# Patient Record
Sex: Female | Born: 1955 | Marital: Married | Smoking: Never smoker
Health system: Southern US, Community
[De-identification: ages and names within clinical notes are randomized; demographics above are authoritative.]

## PROBLEM LIST (undated history)

## (undated) HISTORY — PX: SYNOVIAL CYST EXCISION: SUR507

## (undated) HISTORY — PX: TONSILLECTOMY: SUR1361

## (undated) HISTORY — PX: KNEE SURGERY: SHX244

---

## 1983-06-02 HISTORY — PX: BREAST ENHANCEMENT SURGERY: SHX7

## 2008-10-02 DIAGNOSIS — C439 Malignant melanoma of skin, unspecified: Secondary | ICD-10-CM

## 2008-10-02 HISTORY — DX: Malignant melanoma of skin, unspecified: C43.9

## 2011-01-26 ENCOUNTER — Encounter: Payer: Self-pay | Admitting: Neurology

## 2011-01-29 ENCOUNTER — Ambulatory Visit: Payer: Self-pay | Admitting: Neurology

## 2011-02-05 ENCOUNTER — Ambulatory Visit (INDEPENDENT_AMBULATORY_CARE_PROVIDER_SITE_OTHER): Payer: 59 | Admitting: Psychology

## 2011-02-05 DIAGNOSIS — F09 Unspecified mental disorder due to known physiological condition: Secondary | ICD-10-CM

## 2011-03-03 ENCOUNTER — Encounter (INDEPENDENT_AMBULATORY_CARE_PROVIDER_SITE_OTHER): Payer: 59 | Admitting: Psychology

## 2011-03-03 DIAGNOSIS — F0280 Dementia in other diseases classified elsewhere without behavioral disturbance: Secondary | ICD-10-CM

## 2011-03-16 ENCOUNTER — Encounter (INDEPENDENT_AMBULATORY_CARE_PROVIDER_SITE_OTHER): Payer: 59 | Admitting: Psychology

## 2011-03-16 DIAGNOSIS — F0280 Dementia in other diseases classified elsewhere without behavioral disturbance: Secondary | ICD-10-CM

## 2011-10-05 ENCOUNTER — Ambulatory Visit (HOSPITAL_COMMUNITY): Payer: 59 | Admitting: Psychology

## 2011-10-05 ENCOUNTER — Encounter (HOSPITAL_COMMUNITY): Payer: 59 | Admitting: Psychology

## 2011-10-06 ENCOUNTER — Ambulatory Visit (INDEPENDENT_AMBULATORY_CARE_PROVIDER_SITE_OTHER): Payer: 59 | Admitting: Psychology

## 2011-10-06 DIAGNOSIS — G259 Extrapyramidal and movement disorder, unspecified: Secondary | ICD-10-CM

## 2011-10-06 DIAGNOSIS — F09 Unspecified mental disorder due to known physiological condition: Secondary | ICD-10-CM

## 2011-10-06 DIAGNOSIS — G2570 Drug induced movement disorder, unspecified: Secondary | ICD-10-CM

## 2011-11-02 ENCOUNTER — Ambulatory Visit (HOSPITAL_COMMUNITY): Payer: Self-pay | Admitting: Psychology

## 2011-11-03 ENCOUNTER — Ambulatory Visit (INDEPENDENT_AMBULATORY_CARE_PROVIDER_SITE_OTHER): Payer: 59 | Admitting: Psychology

## 2011-11-17 ENCOUNTER — Ambulatory Visit (INDEPENDENT_AMBULATORY_CARE_PROVIDER_SITE_OTHER): Payer: 59 | Admitting: Psychology

## 2012-01-12 ENCOUNTER — Encounter (HOSPITAL_COMMUNITY): Payer: Self-pay | Admitting: Psychology

## 2012-01-12 NOTE — Progress Notes (Signed)
Patient:   Robin Armstrong   DOB:   1956-02-11  MR Number:  [id number]  Location:  [facility] [address] [place] [address] Dept: [phone]           Date of Service:   10/06/2011  Start Time:   10 a.m. End Time:   11 AM  Provider/Observer:  Hershal Coria PSYD       Billing Code/Service: [phone]  Chief Complaint:     Chief Complaint  Patient presents with  . Memory Loss  . Other    Expressive language difficulties, tremors, and other cognitive changes    Reason for Service:  The patient is back for followup neuropsychological testing as followup from her evaluation done in October of 2012. At that time, the patient displayed some significant neuropsychological deficits and fell almost exclusively with regard to memory deficits and particularly in the auditory/verbal areas. Gen. IQ and cognitive abilities were all in the average to high average range with no deficits with regard to any subtests on the formal IQ measures. She displayed average to high average performance in every measure assessed with some mild weakness 4 information processing speed and mathematical abilities. Comparing these to objective assessment of learning and memory showed clear deficits. Significant deficits on measures of auditory learning there were almost exclusively in the areas of storage of new information. While the evaluation did consider the possibility of cortically mediated progressive dementia is that were not consistent with Parkinson's or patterns consistent with Alzheimer's new information after that evaluation was conducted suggested that she had what appeared to be a mild head injury 17 months earlier from that evaluation. These focal memory deficits could very well be related to residual effects of a mild head injury. However, repeat neuropsychological testing would need to be done to rule out rule in  progressive nature to this condition. If this pattern of neuropsychological deficits remain stable will very likely be more indicative of residual effects of a mild head injury and not those of a progressive neuropsychological condition such as Parkinson's/Alzheimer's/other cortically mediated progressive dementias.  Current Status:  The patient reports that changes since her last visit included increasing fine motor difficulties and dropping things. She reports that her right eye is "heavy" when compared to her left thigh but that she is not feeling sleepy. The patient's Trileptal was decreased in response to this. The patient does report that she is continuing to have some memory problems. She reports that when she is driving but she is at times where she will go past where she is going before realizing this reports that this is very much the same as before. Patient reports that she feels like she is "zoning out" more than before. She reports that she feels weaker but does not feel particularly fatigued. She just feels like there is a decrease in strength. The patient reports that she has not been feeling particularly depressed but is isolating more from others do to forgetting names and concern about memory difficulties. She reports that she continues to forget where she put stuff. She reports that she is still having days where she is shaking in her hands and that her left leg worked up when she is at rest. She reports does have good days and bad days.  Reliability of Information: The information was provided by both the patient and her husband and appears to be very reliable  Behavioral Observation: Wahneta Derocher  Koppenhaver  presents as a 56 y.o.-year-old Right Caucasian Female who appeared her stated age. her dress was Appropriate and she was Well Groomed and her manners were Appropriate to the situation.  There were not any physical disabilities noted.  she displayed an appropriate level of cooperation and  motivation.    Interactions:    Active   Attention:   within normal limits  Memory:   within normal limits  Visuo-spatial:   within normal limits  Speech (Volume):  normal  Speech:   normal pitch and normal volume  Thought Process:  Coherent  Though Content:  WNL  Orientation:   person, place, time/date and situation  Judgment:   Good  Planning:   Good  Affect:    Depressed  Mood:    Depressed  Insight:   Good  Intelligence:   high  Substance Use:  No concerns of substance abuse are reported.    Education:   College  Medical History:  History reviewed. No pertinent past medical history.      Outpatient Encounter Prescriptions as of 10/06/2011  Medication Sig Dispense Refill  . clorazepate (TRANXENE) 15 MG tablet Take 15 mg by mouth 2 (two) times daily.      Marland Kitchen FLUoxetine (PROZAC) 20 MG capsule Take 20 mg by mouth daily.      Marland Kitchen lamoTRIgine (LAMICTAL) 100 MG tablet Take 100 mg by mouth daily.      . OXcarbazepine (TRILEPTAL) 150 MG tablet Take 150 mg by mouth 2 (two) times daily.      . QUEtiapine (SEROQUEL) 100 MG tablet Take 100 mg by mouth at bedtime.              Sexual History:   History  Sexual Activity  . Sexually Active:     Family Med/Psych History:  Family History  Problem Relation Age of Onset  . Bipolar disorder Father   . Bipolar disorder Paternal Grandfather   . Bipolar disorder Daughter     Risk of Suicide/Violence: moderate the patient has a history of suicide attempts there've been significant and life-threatening. This happened on multiple occasions. Denies suicidal ideation at this time. She reports that she has attempted suicide at least 5 times in the past and a significant overdose was with aspirin.  Impression/DX:  At this point, the previous neuropsychological evaluation did show auditory learning and memory deficits. Much of the other neuropsychological measures were within normal limits. 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 However, after that evaluation report was written new information came to live with the patient reported that she had what appeared to be a mild head injury 17 months prior to evaluation in September/October of 2012. Looking back at the results of that earlier evaluation the deficits were consistent with focal problems in the left temporal lobe and could very well be a result of this mild head injury and residual effects of that.  Disposition/Plan:  We will set up for individual neuropsychological evaluation  Diagnosis:    Axis I:   1. Cognitive disorder   2. Medication-induced movement disorder         Axis II: Deferred

## 2016-01-22 ENCOUNTER — Ambulatory Visit (INDEPENDENT_AMBULATORY_CARE_PROVIDER_SITE_OTHER): Payer: BLUE CROSS/BLUE SHIELD | Admitting: Psychology

## 2016-01-22 DIAGNOSIS — R413 Other amnesia: Secondary | ICD-10-CM | POA: Diagnosis not present

## 2016-02-24 ENCOUNTER — Ambulatory Visit (INDEPENDENT_AMBULATORY_CARE_PROVIDER_SITE_OTHER): Payer: BLUE CROSS/BLUE SHIELD | Admitting: Psychology

## 2016-02-24 DIAGNOSIS — R413 Other amnesia: Secondary | ICD-10-CM | POA: Diagnosis not present

## 2016-03-02 ENCOUNTER — Encounter (HOSPITAL_COMMUNITY): Payer: Self-pay | Admitting: Psychology

## 2016-03-02 NOTE — Progress Notes (Signed)
Today we administered the WAIS-III and the WMS-III

## 2016-03-11 ENCOUNTER — Ambulatory Visit (INDEPENDENT_AMBULATORY_CARE_PROVIDER_SITE_OTHER): Payer: BLUE CROSS/BLUE SHIELD | Admitting: Psychology

## 2016-03-11 DIAGNOSIS — G3183 Dementia with Lewy bodies: Secondary | ICD-10-CM

## 2016-03-11 DIAGNOSIS — F0280 Dementia in other diseases classified elsewhere without behavioral disturbance: Secondary | ICD-10-CM

## 2016-04-13 ENCOUNTER — Telehealth (HOSPITAL_COMMUNITY): Payer: Self-pay | Admitting: *Deleted

## 2016-04-13 NOTE — Telephone Encounter (Signed)
returned phone call regarding records request.

## 2016-04-13 NOTE — Telephone Encounter (Signed)
voice message from patient, she wants to pick up her records tomorrow morning at 9:15.    She has to go to another appointment.

## 2016-04-14 ENCOUNTER — Encounter (HOSPITAL_COMMUNITY): Payer: Self-pay | Admitting: Psychology

## 2016-04-14 NOTE — Progress Notes (Signed)
Today, I provided feedback regarding the results of the recent neuropsychological evaluation and direct comparisons to the evaluation in 2012.  Below are the results of the current evaluation with comparisons to the initial evaluation.  The patient was assessed on 02/27/2016.  The patient was administered a neuropsychological test battery consisting of the Wechsler Adult Intelligence Scale-III and the Wechsler Memory Scale-III.  Behavioral observations suggest that the patient fully participated and this does appear to be a valid assessment.  The patient did appear to try her hardest.  While the patient was very anxious about doing as well as she could, she adapted to the testing situation fairly well and this does appear to be a fair and valid sample of her current functioning.  GENERAL INTELLECTUAL FUNCTIONING:  The patient's performance on the Weschler Adult Intellectual Scale-III, classifies her current intellectual abilities to be in the Average Range, with a Full Scale IQ score of 99, a Verbal IQ score of 96, and a Performance IQ score of 102.  Overall, her performance places her at the 39th percentile with regard to her age-related peer group.  Comparing these results to 2012 suggest mild but consistant and progressive decline in all composite areas.      2012  02/27/2016 FSIQ  111  99  VIQ  108  96 PIQ  113  102   Analysis on Index Scores produced a Verbal Comprehension Index Score of 105, a Perceptual Organization Index Score of 109, a Working Memory Index Score of 82, and a Processing Speed Index Score of 84.      2012  02/27/2016 VCI  114  105 POI  116  109 WM  99  82 PS  111  99  Overall, WAIS-III performance suggest that the patient's VIQ vs. PIQ were similar to last assessment with a consistent decline between both.  Index scores suggest progressive decline in all areas with the greatest decline in Working Memory, which is now in the impaired range.  While other areas are in the  Average Range, the decline is approaching a 1 standard deviation decline.    Below are the Age-Adjusted scores for each of the individual WAIS-III subtests.       2012   02/27/2016  Vocabulary:    14   14 Similarities:    15   11 Arithmetic:    8   6 Digit Span:    10   9 Information:    9   8 Comprehension:   13   9 Letter Number Sequencing:  12   6  Picture Completion:   14   14 Digit Symbol:    8   6 Block Design:    12   11 Matrix Reasoning:   12   10 Picture Arrangement:   14   11 Symbol Search:   11   8  WAIS-III analysis reveals a current VIQ score of 105, which places her at the 63rd percentile.  Significant scatter was noted in subtest performance.  The patient displayed significant strength relative to her age matched your group for her vocabulary skills, which have not changed over the past 5 years.  She performed in the average range for verbal reasoning, auditory encoding, generally fund of information and social judgement and comprehension.  However, all of these memory are mild to moderate declines from the initial testing.  The patient displayed significant deficits for mathematical skills that require more complex attention and concentration, and her more complex auditory  encoding and multiprocessing skills.  The changes in multiprocessing suggest significant decline and the most severe current verbal deficit.    WAIS-III analysis reveals a PIQ score of 102, which places her at the 55th percentile.  Significant scatter was seen on these measures.   The patient displayed moderate strengths on measures assessing visual gestalts and visual attention to details.  Average performance was noted on visual analysis and organization, visual reasoning and visual sequencing.  She displayed mild issues with visual scanning and speed of mental operations.  She displayed significant deficits on measures assessing visual searching and sequencing.  With the exception of one subtest (her best  performance before and now, she displays consistent but mild changes from the initial testing.      Overall, WAIS-III performance in 2012 versus 2017 suggest consistent decline with the biggest decline in areas of working memory and multiprocessing with as holding of abilities of vocabulary.  Mild to moderate decline in all other areas.   ATTENTION/CONCENTRATION AND MEMORY:  On the WMS-III, the patient obtained a Working Memory (Attention and Encoding)  Score of 83, which is impared and significanly below what was achieved in 2012.  This is the only area of decline noted on memory measures identified when compared to 2012.          2012  Current Predicted based on FSIQ: Actual Score  Working Memory:   108   99    83 Immediate Memory:   87   99    91 Auditory Immediate:   77   99    94 Visual Immediate:   103   100    91 General Memory:   89   99    91 Auditory Delayed:   83   100    86 Visual Delayed:   106   100    103 Auditory Recognition Delayed:  85   100    90  Anterograde memory functions, as analyzed by the WMS-III, continued to suggest memory issues for auditory memory from estimates of lifetime functioning.  She continues to show issues with both immediate memory for auditory learning and immediate visual learning.  However, she continues to have better delayed memory for visual information.  Cuing does not appear to help with the more significant auditory memory deficits.  Global memory does not appear to display any significant change or loss over the past 5 years.  CONCLUSIONS:    The overall results of these formal and objective measures of cognitive performance looking at both performance in 2012 and the current assessment suggest progressive decline in overall cognitive performance.  The biggest decline is in the areas of attention/concentation, multiprocessing and overall speed of mental operations.  There also are declines in general comprehension skills and verbal reasoning.   There was continued memory deficits noted, but there were not indications of progressive decline in general memory abilities.     This overall pattern of relative strengths and weaknesses do suggest ongoing cognitive deficits.  The current pattern and time line of changes are not consistent with those of Parkinson's Dementia or of Alzheimer's Dementia.  There are mild to moderate declines in general cognitive performance but with the exception of significant Working Memory declines, memory function is generally consistent with testing 5 years ago.  The pattern of strengths and weakness and reported symptoms are most consistent with Lewy Body Dementia, although there are inconsistencies with even this condition.  The patient is showing very slow decline  over 5 years in some areas and little or no further decline other than Working Marine scientist, Conservator, museum/gallery, Facilities manager, and General Comprehension.  Other memory functions remain consistant with previous assessment.  The patient also denies any visual hallucinations, which are typical symptoms of Lewy Body Dementia at 5 years post start of symptoms.  She also denies Geographic Disorientation, a symptom common with Alzheimer's at 5 plus years and does not have progressive worsening of Tremor, which you would expect from Parkinson's.    We should look at repeat testing in 1 year.

## 2016-04-14 NOTE — Progress Notes (Signed)
Patient:   Robin Armstrong   DOB:   [birth year]  MR Number:  [id number]  Location:  [facility] [address] [place] [address] Dept: [phone]           Date of Service:   01/22/2016  Start Time:   11 AM End Time:   12 PM  Provider/Observer:  Edgardo Roys PSYD       Billing Code/Service: [phone]  Chief Complaint:     Chief Complaint  Patient presents with  . Memory Loss    Reason for Service:  The patient has returned for follow up neuropsychological evaluation.  She was initially seen in 2012 after being referred for neuropsychological evaluation after the development of significant memory difficulties, expressive language deficits and development of tremor.  There were also description of instances described where she would "space out" and develop blank stares.  These changes were described as progressive changes that have been present to some degree over the past year but not showing a lot of changes over the most recent 3 or 4 months.    Current Status:  Currently, the patient and her husband report that the patient has been followed by a nurse practioner and a threapist at [place] and [place].  The patient reports that she has had some changes in her life since I last saw her.  She has had knee replacement and 3 new grandchildren.    The patient reports that she has continued to have continued to have memory loss that appears to continued to worsen.  She reports continued short term memory loss and she continues to misplace things.  Expressive language issues (mostly word finding issues) including word finding issues and paraphasic errors are also reported.  She also reports increasing anxiety and time awareness disturbance.  She reports some issues related to Geographic Disorientation when she is in [place] and does okay in [place] (her primary residence).  She describes  a steady decline over the past years.    Reliability of Information: Information is provided by the patient, her husband and review of medical records.  Behavioral Observation: Robin Armstrong  presents as a 60 y.o.-year-old Right Caucasian Female who appeared her stated age. her dress was Appropriate and she was Well Groomed and her manners were Appropriate to the situation.  There were any physical disabilities noted.  she displayed an appropriate level of cooperation and motivation.      Interactions:    Active   Attention:   The patient did appear to be distracted by internal thoughts and confussed by some questions.  Memory:   The patient had clear memory issues at times and would often interact with her husband for back-up and clarification.  Visuo-spatial:   within normal limits  Speech (Volume):  normal  Speech:   normal pitch  Thought Process:  Coherent  Though Content:  WNL  Orientation:   person, place, time/date and situation  Judgment:   Fair  Planning:   Fair  Affect:    Anxious  Mood:    Anxious  Insight:   Good  Intelligence:   high  Marital Status/Living: The patient was born is [place] and grew up in [place].  She was an only child and grew up in a middle class neighborhood.  There were some trauma events reported in the past.  The patient is married to her only husband.  They have  three children.    Current Employment: Patient no longer working  Substance Use:  No concerns of substance abuse are reported.    Education:   Engineering geologist History:  No past medical history on file.      Outpatient Encounter Prescriptions as of 01/22/2016  Medication Sig  . clorazepate (TRANXENE) 15 MG tablet Take 15 mg by mouth 2 (two) times daily.  Marland Kitchen FLUoxetine (PROZAC) 20 MG capsule Take 20 mg by mouth daily.  Marland Kitchen lamoTRIgine (LAMICTAL) 100 MG tablet Take 100 mg by mouth daily.  . OXcarbazepine (TRILEPTAL) 150 MG tablet Take 150 mg by mouth 2 (two)  times daily.  . QUEtiapine (SEROQUEL) 100 MG tablet Take 100 mg by mouth at bedtime.   No facility-administered encounter medications on file as of 01/22/2016.           Sexual History:   History  Sexual Activity  . Sexual activity: Not on file    Abuse/Trauma History: There is some history of a lest one traumatic event when she was young.  Psychiatric History:  The patient has a long history of psychiatric care going back to 89.  She was diagnosed with depression as well as anxiety.  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  The patient has had significant suicide attempts in the past with at leased 5 attempts.  Family Med/Psych History:  Family History  Problem Relation Age of Onset  . Bipolar disorder Father   . Bipolar disorder Paternal Grandfather   . Bipolar disorder Daughter     Risk of Suicide/Violence: low The patient currently denies suicidal or homicidal ideations.  Impression/DX:  The patient has a long history of mood disturbance being treated with both mood stabilizers and SSRI medications.  Starting around 2011 or 2012 she started developing memory issues, expressive language changes, hand tremors and geographic disorientation.  The first evaluation in 2012 suggested significant neuropsychological deficits that were almost exclusively with regard to memory deficits, particularly in the auditory/verbal areas.  General intellectual and cognitive abilities were all in the average to high range. The pattern of results at that time were judged to be most consistent with cortical based issues and subcortical issues.  There were consistent with a progressive cortical dementia with some similarities to patterns associated with Alzheimer's type dementia but has others areas that were not consistent with Alzheimer's.  The pattern was not consistent with Parkinson's Dementia.   Follow-up testing was recommended.    Disposition/Plan:  Repeat neuropsychological  assessment with comparisons to 2012 results.  Diagnosis:    Axis I:  Memory loss of unknown cause        Electronically Signed   _______________________ Ilean Skill, Psy.D.  01/22/2016

## 2016-04-17 ENCOUNTER — Telehealth (HOSPITAL_COMMUNITY): Payer: Self-pay | Admitting: *Deleted

## 2016-04-17 NOTE — Telephone Encounter (Signed)
Pt came into office to pick up records that were requested per previous phone call. Pt D/l number is [phone] exp 09-20-16

## 2016-10-05 ENCOUNTER — Encounter: Payer: Self-pay | Admitting: Neurology

## 2016-12-17 ENCOUNTER — Ambulatory Visit (INDEPENDENT_AMBULATORY_CARE_PROVIDER_SITE_OTHER): Payer: BLUE CROSS/BLUE SHIELD | Admitting: Neurology

## 2016-12-17 ENCOUNTER — Other Ambulatory Visit (INDEPENDENT_AMBULATORY_CARE_PROVIDER_SITE_OTHER): Payer: BLUE CROSS/BLUE SHIELD

## 2016-12-17 ENCOUNTER — Encounter: Payer: Self-pay | Admitting: Neurology

## 2016-12-17 VITALS — BP 110/70 | HR 64 | Ht 64.0 in | Wt 140.0 lb

## 2016-12-17 DIAGNOSIS — G3184 Mild cognitive impairment, so stated: Secondary | ICD-10-CM | POA: Diagnosis not present

## 2016-12-17 DIAGNOSIS — F32A Depression, unspecified: Secondary | ICD-10-CM

## 2016-12-17 DIAGNOSIS — F329 Major depressive disorder, single episode, unspecified: Secondary | ICD-10-CM | POA: Diagnosis not present

## 2016-12-17 MED ORDER — RIVASTIGMINE 9.5 MG/24HR TD PT24: 9.5000 mg | MEDICATED_PATCH | Freq: Every day | TRANSDERMAL | 2 refills | Status: DC

## 2016-12-17 NOTE — Patient Instructions (Signed)
1.  We will increase Exelon patch to 9.5mg , switch every 24 hours. 2.  Check B12 level 3.  Repeat neurocognitive testing with Dr. Si Raider. 4.  Follow up after testing.

## 2016-12-17 NOTE — Progress Notes (Signed)
NEUROLOGY CONSULTATION NOTE  [name] MRN: [id number] DOB: 04-02-1956  Referring provider: Dr. Casimiro Needle Primary care provider: Dr. Harmon Pier  Reason for consult:  Cognitive impairment  HISTORY OF PRESENT ILLNESS: Robin Armstrong Daily is a 61 year old right-handed white female with chronic depression who presents for evaluation of mild cognitive impairment.  She is accompanied by her husband who supplements history.  History is also supplemented by note from Dr. Casimiro Needle, as well as prior neuropsychological reports.  In 2011 or 2012, she started experiencing problems with cognition.  At first, she had trouble with balance.  She would sometimes feel unsteady when she first stood up and started walking.  She also had increased agitation.  She developed intention and kinetic tremors in both hands.  Sometimes her leg may tremor as well.  She started to to have more difficulty making decisions.  It started taking her longer to multitask, such as cooking.  She reports difficulty remembering names of familiar people, such as friends.  She often misplaces objects.  Sometimes she can't remember what she did that day, such as taking a shower or her medications.  She reports word-finding difficulty and sometimes makes paraphasic errors.  She has limited her driving because she has gotten lost on familiar routes.  Now she only drives locally in town.  She says she sometimes stares off for a few seconds.  She loses track of her thoughts.  She is easily distracted.  She reports fatigue.  She denies hallucinations.  She is able to perform basic ADLs.  She has longstanding history of major depression with prior suicide attempts, however she says her depression has been well-controlled for several years.  She sleeps well with Seroquel.  She underwent a neuropsychological evaluation in 2012, which demonstrated cognitive deficits involving auditory and verbal memory.  Reportedly, some findings were associated with  Alzheimer's dementia, while other findings were not consistent with Alzheimer's dementia.  She had a repeat neuropsychological evaluation in August 2017 which demonstrated findings consistent with Lewy Body Dementia, however she did not exhibit the classic symptoms (such as hallucinations).  Workup included . MRI of brain with and without contrast from 03/02/16 was personally reviewed and was unremarkable. TSH from 03/09/16 was 0.92.  She is currently Exelon patch now 4.6mg /24h.  She previously took Aricept, which caused GI upset.  Of note, she presented to an outside ED in [facility] on 11/28/16 for BPPV.  Report of head CT was unremarkable.  CBC and CMP were reviewed and were unremarkable.  She has 2 years of community college.  She worked as a Dance movement psychotherapist.  She has no history of head trauma.  She has 6-7 drinks a week.  Her mother does not have dementia.  Her father passed away at 4 and did not have dementia up until then.  PAST MEDICAL HISTORY: Depression  PAST SURGICAL HISTORY: Past Surgical History:  Procedure Laterality Date  . BREAST ENHANCEMENT SURGERY  1985  . KNEE SURGERY    . SYNOVIAL CYST EXCISION    . TONSILLECTOMY      MEDICATIONS: Current Outpatient Prescriptions on File Prior to Visit  Medication Sig Dispense Refill  . FLUoxetine (PROZAC) 20 MG capsule Take 20 mg by mouth daily.    Marland Kitchen lamoTRIgine (LAMICTAL) 100 MG tablet Take 100 mg by mouth daily.    . OXcarbazepine (TRILEPTAL) 150 MG tablet Take 150 mg by mouth 2 (two) times daily.    . QUEtiapine (SEROQUEL) 100 MG tablet Take 100  mg by mouth at bedtime.     No current facility-administered medications on file prior to visit.     ALLERGIES: Allergies  Allergen Reactions  . Erythromycin Rash    FAMILY HISTORY: Family History  Problem Relation Age of Onset  . Bipolar disorder Father   . Bipolar disorder Paternal Grandfather   . Bipolar disorder Daughter     SOCIAL HISTORY: Social History    Social History  . Marital status: Married    Spouse name: N/A  . Number of children: 3  . Years of education: 14   Occupational History  . Not on file.   Social History Main Topics  . Smoking status: Never Smoker  . Smokeless tobacco: Never Used  . Alcohol use 2.4 oz/week    4 Glasses of wine per week  . Drug use: No  . Sexual activity: Not on file   Other Topics Concern  . Not on file   Social History Narrative   Lives with husband in 2 story home.  Has 3 children.  Education: college.  Retired Dance movement psychotherapist.      REVIEW OF SYSTEMS: Constitutional: No fevers, chills, or sweats, no generalized fatigue, change in appetite Eyes: No visual changes, double vision, eye pain Ear, nose and throat: No hearing loss, ear pain, nasal congestion, sore throat Cardiovascular: No chest pain, palpitations Respiratory:  No shortness of breath at rest or with exertion, wheezes GastrointestinaI: No nausea, vomiting, diarrhea, abdominal pain, fecal incontinence Genitourinary:  No dysuria, urinary retention or frequency Musculoskeletal:  No neck pain, back pain Integumentary: No rash, pruritus, skin lesions Neurological: as above Psychiatric: No depression, insomnia, anxiety Endocrine: No palpitations, fatigue, diaphoresis, mood swings, change in appetite, change in weight, increased thirst Hematologic/Lymphatic:  No purpura, petechiae. Allergic/Immunologic: no itchy/runny eyes, nasal congestion, recent allergic reactions, rashes  PHYSICAL EXAM: Vitals:  BP 110/70, Pulse 64, SpO2 98%, Wt 140 lb, Ht 5'4" General: No acute distress.  Patient appears well-groomed.  Head:  Normocephalic/atraumatic Eyes:  fundi examined but not visualized Neck: supple, no paraspinal tenderness, full range of motion Back: No paraspinal tenderness Heart: regular rate and rhythm Lungs: Clear to auscultation bilaterally. Vascular: No carotid bruits. Neurological Exam: Mental status: alert and oriented  to person, place, and time, recent and remote memory intact, fund of knowledge intact, attention and concentration mildly reduced, speech fluent and not dysarthric, language intact. Montreal Cognitive Assessment  12/17/2016  Visuospatial/ Executive (0/5) 5  Naming (0/3) 3  Attention: Read list of digits (0/2) 2  Attention: Read list of letters (0/1) 1  Attention: Serial 7 subtraction starting at 100 (0/3) 1  Language: Repeat phrase (0/2) 2  Language : Fluency (0/1) 1  Abstraction (0/2) 2  Delayed Recall (0/5) 3  Orientation (0/6) 6  Total 26  Adjusted Score (based on education) 26   Cranial nerves: CN I: not tested CN II: pupils equal, round and reactive to light, visual fields intact CN III, IV, VI:  full range of motion, no nystagmus, no ptosis CN V: facial sensation intact CN VII: upper and lower face symmetric CN VIII: hearing intact CN IX, X: gag intact, uvula midline CN XI: sternocleidomastoid and trapezius muscles intact CN XII: tongue midline Bulk & Tone: normal, no fasciculations. Motor:  5/5 throughout  Sensation:  Pinprick and vibration sensation intact. Deep Tendon Reflexes:  2+ throughout, toes downgoing.  Finger to nose testing:  Mild fine kinetic tremor, without dysmetria.  Heel to shin:  Without dysmetria.  Gait:  Normal  station and stride.  Able to turn and tandem walk (with mild unsteadiness). Romberg negative.  IMPRESSION: Mrs. Mcgaugh describes cognitive impairment in memory, attention/concentration, language, geographic orientation.  I do not think she has Lewy Body Dementia.  I am not certain of exact diagnosis at this time and not sure how much depression may be playing a role. 1.  Cognitive impairment 2.  Depression  PLAN: 1.  I will increase Exelon patch to 9.5mg  daily.  I wouldn't increase dose further until after neuropsychological testing. 2.  I will check B12 level 3.  I will repeat neuropsychological testing with Dr. Si Raider 4.  Follow up after  testing.  Thank you for allowing me to take part in the care of this patient.  Metta Clines, DO  CC:  Margaretha Sheffield, MD  Norma Fredrickson, MD

## 2016-12-18 LAB — VITAMIN B12: VITAMIN B 12: 274 pg/mL (ref 211–911)

## 2016-12-28 ENCOUNTER — Telehealth: Payer: Self-pay | Admitting: Neurology

## 2016-12-28 NOTE — Telephone Encounter (Signed)
-----   Message from Pieter Partridge, DO sent at 12/28/2016 10:41 AM EDT ----- B12 is 274, which is low normal range.  This may contribute to memory problems, so I recommend starting 1066mcg daily.

## 2016-12-28 NOTE — Telephone Encounter (Signed)
Patient made aware of results/recommendations. 

## 2017-01-28 ENCOUNTER — Encounter: Payer: Self-pay | Admitting: Internal Medicine

## 2017-03-22 ENCOUNTER — Encounter: Payer: Self-pay | Admitting: Psychology

## 2017-03-22 ENCOUNTER — Ambulatory Visit (INDEPENDENT_AMBULATORY_CARE_PROVIDER_SITE_OTHER): Payer: BLUE CROSS/BLUE SHIELD | Admitting: Psychology

## 2017-03-22 DIAGNOSIS — R413 Other amnesia: Secondary | ICD-10-CM | POA: Diagnosis not present

## 2017-03-22 DIAGNOSIS — F32A Depression, unspecified: Secondary | ICD-10-CM

## 2017-03-22 DIAGNOSIS — F329 Major depressive disorder, single episode, unspecified: Secondary | ICD-10-CM

## 2017-03-22 NOTE — Progress Notes (Signed)
NEUROPSYCHOLOGICAL INTERVIEW (CPT: [id number])  Name: Robin Armstrong Date of Birth: [birth year] Date of Interview: 03/22/2017  Reason for Referral:  Robin Armstrong is a 61 y.o. right-handed female who is referred for neuropsychological evaluation by Dr. Metta Clines of [facility] due to concerns about cognitive impairment. This patient is unaccompanied in the office for today's appointment.  History of Presenting Problem:  [name] was seen by Dr. Tomi Likens for initial neurologic consultation on 12/17/2016. MoCA was 26/30. She reported cognitive decline in memory, attention/concentration, language and geographic orientation since 2011/2012 as well as balance problems, tremors, and episodic staring off for a few seconds. MRI of the brain from 03/02/2016 reportedly was unremarkable. She has had neuropsychological evaluation by Dr. Sima Matas in both 2012 and 01/2016. Most recently Dr. Sima Matas suggested Lewy Body Dementia. Dr. Georgie Chard evaluation did not suggest Lewy Body Dementia. It was noted that B12 was 274, which is low normal range, and it was recommended she start 1072mcg daily.  The patient reported initial onset of cognitive symptoms in 2011 or 2012. She was getting lost in familiar places and having trouble finding items (e.g., looking everywhere for things all the time). She also reported having confusion. At the same time, she developed tremors in her right hand and right leg. In retrospect she wonders if the tremors were due to anxiety related to the cognitive changes. She continues to experience tremors but they have not worsened over time. In the beginning, she did not notice much fluctuation of cognitive or physical symptoms but now she "either feels on or off". Some days she has no tremor and other days it is quite bad. She does not take any medication for the tremors; she did in the past but it made her too nauseous.  Over time, she also started noticing trouble with retaining information she  just read. She also began to notice short term memory problems including forgetting things she had just been told. She writes lots of notes to herself and feels like she is constantly shuffling papers. Word retrieval became an issue and she started making paraphasic errors (e.g., saying "sandal" when she meant "sandwich").   Upon direct questioning, she also endorsed starting but not finishing tasks, being distracted very easily, and processing information more slowly. She has not gotten lost when driving recently but she does have difficulty with geographic orientation when she is walking. She walks daily and takes the same route almost every day but some days she will have to look around to figure out where she is. She also has more trouble orienting herself to their several homes (they have 4 or 5 different homes and she used to adapt well to going back and forth between them but now she has trouble finding light switches and where items are stored).   She continues to report episodic spells of staring off, "in a fog" (these are noticed by others as well).  She reports decline in her ability to perform complex ADLs over the years. She used to manage all the bills and bookkeeping but it started taking her too long and it was difficult to concentrate, so she no longer does this. She typically manages her appointments fine but she has gone on the wrong day before. She does less cooking. She does manage her medications and denies any trouble with her pills but does sometimes have trouble with her Exelon patch. She continues to drive.   She reports a new desire to "be in a smaller  space, two rooms and a potty". She feels overwhelmed in her large house and all the rooms. She wants a simpler environment and simpler life.  The patient reports an episodic vision problem wherein words appear either "split apart" or "two words can be squeezed together". It does not happen all the time. It is never associated with  a headache. She does have a history of migraines but has not had any in at least 10 years.  She also reports balance problems and unsteadiness. She has not had any falls. She does not have dizziness or lightheadedness. There is no history of head injury. She has not had any issues with urinary incontinence but does not recent onset of bowel urgency/incontinence. She is scheduled for a colonoscopy.  Per Dr. Ferne Coe records, psychiatric history is significant for depression and anxiety with psychiatric care going back to 1985. She has a remote history of at least 5 suicide attempts. It was also noted there is a history of at least one traumatic event when she was young. Family psychiatric history is reportedly significant for bipolar disorder in her father, paternal grandfather and daughter, per Dr. Ferne Coe records.  Currently, she reports variable mood with good days and bad days and largely situational. She endorses anxiety and nervousness. She continues to see her psychiatry and psychotherapy providers regularly. She is pleased with her mental healthcare. She denies any suicidal ideation or intention in the past year. She gets good sleep with Seroquel. She endorses reduced appetite and weight loss over the last 5 years. She has not had any hallucinations of any kind. She thinks she is somewhat more disinhibited in that she will blurt out things to strangers which she would not have done in the past.   There is no known family history of dementia. The patient's mother is alive, 80 years old, and "sharp as a tack". Her father was an alcoholic and did not seem to have dementia before he died.   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 3 grandchildren. Alcohol: Socially, approximately 2 times a week at most, never more than 2 drinks in one day. Tobacco: Never No history of substance abuse/dependence.   Medical History: Depression Anxiety Mild cognitive impairment   Current Medications:  Outpatient Encounter Prescriptions as of 03/22/2017  Medication Sig  . clonazePAM (KLONOPIN) 0.1 mg/mL SUSP Take by mouth.  Marland Kitchen FLUoxetine (PROZAC) 20 MG capsule Take 20 mg by mouth daily.  Marland Kitchen lamoTRIgine (LAMICTAL) 100 MG tablet Take 100 mg by mouth daily.  . OXcarbazepine (TRILEPTAL) 150 MG tablet Take 150 mg by mouth 2 (two) times daily.  . QUEtiapine (SEROQUEL) 100 MG tablet Take 100 mg by mouth at bedtime.  . rivastigmine (EXELON) 9.5 mg/24hr Place 1 patch (9.5 mg total) onto the skin daily.   No facility-administered encounter medications on file as of 03/22/2017.      Behavioral Observations:   Appearance: Casually dressed and appropriately groomed Gait: Ambulated independently, no gross abnormalities observed Speech: Fluent. Mild word finding difficulty. Thought process: Difficulty organizing her thoughts, distractible Affect: Full, euthymic, mildly anxious Interpersonal: Very pleasant, appropriate   TESTING: There is medical necessity to proceed with neuropsychological assessment as the results will be used to aid  in differential diagnosis and clinical decision-making and to inform specific treatment recommendations. Per the patient and medical records reviewed, there has been a change in cognitive functioning and a reasonable suspicion of neurocognitive disorder.  Following the clinical interview, the patient completed a full battery of neuropsychological testing with my psychometrician under my supervision.   PLAN: The patient will return to see me for a follow-up session at which time her test performances and my impressions and treatment recommendations will be  reviewed in detail.  Full report to follow.

## 2017-03-22 NOTE — Progress Notes (Signed)
   Neuropsychology Note  Robin Armstrong came in today for 2 hours of neuropsychological testing with technician, Robin Armstrong, BS, under the supervision of Dr. Macarthur Critchley. The patient did not appear overtly distressed by the testing session, per behavioral observation or via self-report to the technician. Rest breaks were offered. Robin Armstrong will return within 2 weeks for a feedback session with Dr. Si Raider at which time her test performances, clinical impressions and treatment recommendations will be reviewed in detail. The patient understands she can contact our office should she require our assistance before this time.  Full report to follow.

## 2017-03-23 ENCOUNTER — Encounter: Payer: Self-pay | Admitting: Psychology

## 2017-03-23 ENCOUNTER — Ambulatory Visit: Payer: Self-pay | Admitting: Internal Medicine

## 2017-04-02 NOTE — Progress Notes (Signed)
NEUROPSYCHOLOGICAL EVALUATION   Name:    Robin Armstrong  Date of Birth:   11-May-1956 Date of Interview:  03/22/2017 Date of Testing:  03/22/2017   Date of Feedback:  04/06/2017       Background Information:  Reason for Referral:  Robin Armstrong is a 61 y.o. right-handed female referred by Dr. Metta Clines to assess her current level of cognitive functioning and assist in differential diagnosis. The current evaluation consisted of a review of available medical records, an interview with the patient, and the completion of a neuropsychological testing battery. Informed consent was obtained.  History of Presenting Problem:  Robin Armstrong was seen by Dr. Tomi Likens for initial neurologic consultation on 12/17/2016. MoCA was 26/30. She reported cognitive decline in memory, attention/concentration, language and geographic orientation since 2011/2012 as well as balance problems, tremors, and episodic staring off for a few seconds. MRI of the brain from 03/02/2016 reportedly was unremarkable. She has had neuropsychological evaluation by Dr. Sima Matas in both 2012 and 01/2016. I was able to personally review these records. Unfortunately the patient was administered outdated tests (WAIS-III and WMS-III). That being said, overall, her test results in 2012 indicated performances generally within the normal range (for the outdated normative sample). In 2017, her testing results were largely consistent with those five years earlier, from a statistical standpoint, but there was a notable decline in verbal reasoning abilities and working memory abilities (although these scores still fell within the normal range overall). In his 2017 report, Dr. Sima Matas suggested the possibility of Lewy Body Dementia. Dr. Georgie Chard evaluation in 2018 did not support a diagnosis of Lewy Body Dementia. It was noted that B12 was 274, which is low normal range, and it was recommended she start 1052mcg daily.  The patient reported initial onset of  cognitive symptoms in 2011 or 2012. She was getting lost in familiar places and having trouble finding items (e.g., looking everywhere for things all the time). She also reported having confusion. At the same time, she developed tremors in her right hand and right leg. In retrospect she wonders if the tremors were due to anxiety related to the cognitive changes. She continues to experience tremors but they have not worsened over time. In the beginning, she did not notice much fluctuation of cognitive or physical symptoms but now she "either feels on or off". Some days she has no tremor and other days it is quite bad. She does not take any medication for the tremors; she did in the past but it made her too nauseous.  Over time, she also started noticing trouble with retaining information she just read. She also began to notice short term memory problems including forgetting things she had just been told. She writes lots of notes to herself and feels like she is constantly shuffling papers. Word retrieval became an issue and she started making paraphasic errors (e.g., saying "sandal" when she meant "sandwich").   Upon direct questioning, she also endorsed starting but not finishing tasks, being distracted very easily, and processing information more slowly. She has not gotten lost when driving recently but she does have difficulty with geographic orientation when she is walking. She walks daily and takes the same route almost every day but some days she will have to look around to figure out where she is. She also has more trouble orienting herself to their several homes (they have 5 different homes and she used to adapt well to going back and forth between them  but now she has trouble finding light switches and where items are stored).   She continues to report episodic spells of staring off, "in a fog" (these are noticed by others as well).  She reports decline in her ability to perform complex ADLs over  the years. She used to manage all the bills and bookkeeping but it started taking her too long and it was difficult to concentrate, so she no longer does this. She typically manages her appointments fine but she has gone on the wrong day before. She does less cooking. She does manage her medications and denies any trouble with her pills but does sometimes have trouble with her Exelon patch. She continues to drive.   She reports a new desire to "be in a smaller space, two rooms and a potty". She feels overwhelmed in her large house and all the rooms. She wants a simpler environment and simpler life.  The patient reports an episodic vision problem wherein words appear either "split apart" or "two words can be squeezed together". It does not happen all the time. It is never associated with a headache. She does have a history of migraines but has not had any in at least 10 years.  She also reports balance problems and unsteadiness. She has not had any falls. She does not have dizziness or lightheadedness. There is no history of head injury. She has not had any issues with urinary incontinence but does note recent onset of bowel urgency/incontinence. She is scheduled for a colonoscopy.  Per Dr. Ferne Coe records, psychiatric history is significant for depression and anxiety with psychiatric care going back to 1985. She has a remote history of at least 5 suicide attempts. It was also noted there is a history of at least one traumatic event when she was young. Family psychiatric history is reportedly significant for bipolar disorder in her father, paternal grandfather and daughter, per Dr. Ferne Coe records.  Currently, she reports variable mood with good days and bad days, with mood depending on situational factors. She endorses anxiety and nervousness. She continues to see her psychiatry and psychotherapy providers regularly. She is pleased with her mental healthcare. She denies any suicidal ideation  or intention in the past year. She gets good sleep with Seroquel. She endorses reduced appetite and weight loss over the last 5 years. She has not had any hallucinations of any kind. She thinks she is somewhat more disinhibited in that she will blurt out things to strangers which she would not have done in the past.   There is no known family history of dementia. The patient's mother is alive, 3 years old, and "sharp as a tack". Her father was an alcoholic and did not seem to have dementia before he died.   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 3 grandchildren. Alcohol: Socially, approximately 2 times a week at most, never more than 2 drinks in one day. Tobacco: Never No history of substance abuse/dependence.   Medical History: Depression Anxiety Mild cognitive impairment   Current medications:  Outpatient Encounter Prescriptions as of 04/06/2017  Medication Sig  . clonazePAM (KLONOPIN) 0.1 mg/mL SUSP Take by mouth.  Marland Kitchen FLUoxetine (PROZAC) 20 MG capsule Take 60 mg by mouth  daily.   . lamoTRIgine (LAMICTAL) 100 MG tablet Take 200 mg by mouth 2 (two) times daily.   . OXcarbazepine (TRILEPTAL) 150 MG tablet Take 150 mg by mouth 2 (two) times daily.  . QUEtiapine (SEROQUEL) 100 MG tablet Take 25 mg by mouth at bedtime.   . rivastigmine (EXELON) 9.5 mg/24hr Place 1 patch (9.5 mg total) onto the skin daily.   No facility-administered encounter medications on file as of 04/06/2017.      Current Examination:  Behavioral Observations:  Appearance: Casually dressed and appropriately groomed Gait: Ambulated independently, no gross abnormalities observed Speech: Fluent. Mild  word finding difficulty. Thought process: Difficulty organizing her thoughts, distractible During the testing process, she was attentive and did not require any repetition of task instructions Affect: Full, euthymic, mildly anxious Interpersonal: Very pleasant, appropriate Orientation: Oriented to person, place and most aspects of time (off on the current date by one day). Accurately named the current President and his predecessor.   Tests Administered: . Test of Premorbid Functioning (TOPF) . Wechsler Adult Intelligence Scale-Fourth Edition (WAIS-IV): Music therapist, Arithmetic, Symbol Search, Coding and Digit Span subtests . Wechsler Memory Scale-Fourth Edition (WMS-IV) Adult Version (ages 26-69): Logical Memory I, II and Recognition subtests  . [place] Verbal Learning Test - 2nd Edition (CVLT-2) Alternate Form . [employer] (WCST) . Repeatable Battery for the Assessment of Neuropsychological Status (RBANS) Form A:  Figure Copy and Figure Recall, Line Orientation and Semantic Fluency subtests . Controlled Oral Word Association Test (COWAT) . Trail Making Test A and B . Boston Diagnostic Aphasia Examination (BDAE): Complex Ideational Material Subtest . Neuropsychological Assessment Battery (NAB) Language Module, Form 1: Bill Payment subtest . [place] Naming Test (BNT) . Beck Depression Inventory - Second edition (BDI-II) . Generalized Anxiety Disorder - 7 item screener (GAD-7)  Test Results: Note: Standardized scores are presented only for use by appropriately trained professionals and to allow for any future test-retest comparison. These scores should not be interpreted without consideration of all the information that is contained in the rest of the report. The most recent standardization samples from the test publisher or other sources were used whenever possible to derive standard scores; scores were corrected for age, gender, ethnicity and education when available.    Test Scores:  Test Name Raw Score Standardized Score Descriptor  TOPF 50/70 SS= 107 Average  WAIS-IV Subtests     Block Design 33/66 ss= 9 Average  Arithmetic 10/22 ss= 7 Low average  Symbol Search 19/60 ss= 6 Low average  Coding 45/135 ss= 7 Low average  Digit Span 29/48 ss= 11 Average  WAIS-IV Index Scores     Working Memory  SS= 95 Average  Processing Speed  SS= 81 Low average  WMS-IV Subtests     LM I 15/50 ss= 5 Borderline  LM II 15/50 ss= 7 Low average  LM II Recognition 23/30 Cum %: 26-50   CVLT-II Scores     Trial 1 7/16 Z= 0.5 Average  Trial 5 10/16 Z= -1 Low average  Trials 1-5 total 52/80 T= 55 Average   SD Free Recall 12/16 Z= 0.5 Average  SD Cued Recall 12/16 Z= 0 Average  LD Free Recall 8/16 Z= -1 Low average  LD Cued Recall 14/16 Z= 1 High average  Recognition Discriminability 16/16 hits; 0 false positives Z= 1.5 Superior  Forced Choice Recognition 16/16  WNL  WCST     Total Errors 22 T= 43 Average  Perseverative Responses 10 T= 49 Average  Perseverative Errors 10 T= 47 Average  Conceptual Level Responses 34 T= 42 Low average  Categories Completed 2 11-16%   Trials to Complete 1st Category 19 11-16%   Failure to Maintain Set 0  WNL  RBANS Subtests     Figure Copy 19/20 Z= 0.5 Average  Figure Recall 15/20 Z= 0.4 Average  Line Orientation 17/20 Z= 0.1 Average  Semantic Fluency 21/40 Z= 0 Average  BDAE Complex Ideational Material 12/12  WNL  NAB Bill Payment 17/19 T= 37 Low average  BNT 53/60 T= 43 Average  COWAT-FAS 49 T= 56 Average  COWAT-Animals 18 T= 50 Average  Trail Making Test A  34" 0 errors T= 52 Average  Trail Making Test B  125" 0 errors T= 39 Low average  BDI-II 20/63  Moderate  GAD-7 12/21  Moderate     Description of Test Results:  Embedded performance validity indicators were within normal limits. As such, the patient's current performance on neurocognitive testing is judged to be a relatively accurate representation of her  current level of neurocognitive functioning.   Premorbid verbal intellectual abilities were estimated to have been within the average range based on a test of word reading. Psychomotor processing speed was low average. Auditory attention and working memory were average. Visual-spatial construction was average. Visual-spatial perception on a line orientation task was average. Language abilities were within normal limits. Specifically, confrontation naming was average, and semantic verbal fluency was average. Auditory comprehension of complex ideational material was intact. On a simulated bill payment task requiring multiple aspects of expressive and receptive language, she performed within the normal range but slightly below expectation (low average); the two errors made on this task were seemingly due to reduced attention to detail when reading information. With regard to verbal memory, encoding and acquisition of non-contextual information (i.e., word list) was average. After a brief interference task, free recall was average (12/16 items). She did not recall any additional items with semantic cueing (average). After a delay, free recall was low average (8/16 items). Cued recall however was high average (14/16 items). Performance on a yes/no recognition task was superior with 100% accuracy. On another verbal memory test, encoding and acquisition of contextual auditory information (i.e., short stories) was borderline. After a delay, free recall was low average but she did recall 100% of previously encoded information. Performance on a yes/no recognition task was below expectation. With regard to non-verbal memory, delayed free recall of visual information was average. Executive functioning was within normal limits overall but perhaps mildly below expectation for this patient. Mental flexibility and set-shifting were low average on Trails B; she did not commit any errors on this task. Verbal fluency with phonemic  search restrictions was average. Deductive reasoning was low average, mildly below expectation. She did not demonstrate a high degree of perseverative errors and did not have difficulty maintaining set. On a self-report measure of mood, the patient's responses were indicative of clinically significant depression at the present time. She denied suicidal ideation or intention. On a self-report measure of anxiety, the patient endorsed clinically significant generalized anxiety.    Clinical Impressions: Major depressive disorder, moderate, chronic; Generalized anxiety disorder.  Results of the current evaluation reveal performances that are largely within normal limits for age and largely consistent with estimated premorbid intellectual abilities. However, her cognitive profile of strengths and weaknesses suggests relative weaknesses in processing speed and encoding of auditory information (when the information is not presented more than once). There are no signs of  memory consolidation dysfunction, visual-spatial processing impairment, or language impairment.  Direct comparison to her last two evaluations is somewhat limited due to the outdated test publications/normative samples used in 2012 and 2017. Having said that, generally speaking, her performances on the current evaluation suggest improvements in working memory and declines in psychomotor processing speed.  Based on the current test results, as well as consideration of her previous test results six years ago and then one year ago, I do not see evidence of a neurodegenerative disorder or dementia. At the very most, we could classify this as mild cognitive impairment, but I am hesitant to use this label because I am not convinced there is a primary neurologic cause of her cognitive symptoms. Specifically, she has chronic depression and anxiety, and her cognitive profile with slowed processing speed and mild executive difficulties is commonly seen in  individuals with these conditions who do not have a neurologic condition.   Recommendations/Plan: The patient was reassured that her test results are not indicative of an underlying neurodegenerative disorder or dementia. I explained that all of the available data suggests it is most likely that her current cognitive complaints are a part of her depression/anxiety rather than a neurologic process. Continued mental health treatment is indicated. She may also benefit from behavioral strategies to enhance cognitive functioning in daily life, and I reviewed a list of such strategies with her. If there is a significant change in cognitive status or daily functioning observed or reported in the future, neuropsychological re-evaluation may be useful at that time.   Feedback to Patient: Robin Armstrong and her husband returned for a feedback appointment on 04/06/2017 to review the results of her neuropsychological evaluation with this provider. 40 minutes face-to-face time was spent reviewing her test results, my impressions and my recommendations as detailed above.    Total time spent on this patient's case: [id number] unit for interview with psychologist; [phone] units of testing by psychometrician under psychologist's supervision; [phone] units for medical record review, scoring of neuropsychological tests, interpretation of test results, preparation of this report, and review of results to the patient by psychologist.      Thank you for your referral of Robin Armstrong. Please feel free to contact me if you have any questions or concerns regarding this report.

## 2017-04-06 ENCOUNTER — Ambulatory Visit: Payer: BLUE CROSS/BLUE SHIELD | Admitting: Psychology

## 2017-04-06 ENCOUNTER — Encounter: Payer: Self-pay | Admitting: Psychology

## 2017-04-06 DIAGNOSIS — R413 Other amnesia: Secondary | ICD-10-CM | POA: Diagnosis not present

## 2017-04-12 ENCOUNTER — Ambulatory Visit: Payer: BLUE CROSS/BLUE SHIELD | Admitting: Neurology

## 2017-04-12 ENCOUNTER — Encounter: Payer: Self-pay | Admitting: Neurology

## 2017-04-12 VITALS — BP 120/58 | HR 64 | Ht 64.0 in | Wt 139.0 lb

## 2017-04-12 DIAGNOSIS — F329 Major depressive disorder, single episode, unspecified: Secondary | ICD-10-CM | POA: Diagnosis not present

## 2017-04-12 DIAGNOSIS — F32A Depression, unspecified: Secondary | ICD-10-CM

## 2017-04-12 NOTE — Progress Notes (Signed)
NEUROLOGY FOLLOW UP OFFICE NOTE  Robin Armstrong [id number]  HISTORY OF PRESENT ILLNESS: Robin Armstrong is a 61 year old right-handed white female with chronic depression who follows up for memory deficits.    UPDATE: B12 level from July was 274.  She was advised to start B12 1067mcg Armstrong.  She underwent neuropsychological testing on 03/22/17.  She performed within normal limits and while she demonstrated symptoms of depression and anxiety, she did not demonstrate any cognitive impairment.   HISTORY: In 2011 or 2012, she started experiencing problems with cognition.  At first, she had trouble with balance.  She would sometimes feel unsteady when she first stood up and started walking.  She also had increased agitation.  She developed intention and kinetic tremors in both hands.  Sometimes her leg may tremor as well.  She started to to have more difficulty making decisions.  It started taking her longer to multitask, such as cooking.  She reports difficulty remembering names of familiar people, such as friends.  She often misplaces objects.  Sometimes she can't remember what she did that day, such as taking a shower or her medications.  She reports word-finding difficulty and sometimes makes paraphasic errors.  She has limited her driving because she has gotten lost on familiar routes.  Now she only drives locally in town.  She says she sometimes stares off for a few seconds.  She loses track of her thoughts.  She is easily distracted.  She reports fatigue.  She denies hallucinations.  She is able to perform basic ADLs.  She has longstanding history of major depression with prior suicide attempts, however she says her depression has been well-controlled for several years.  She sleeps well with Seroquel.  She underwent a neuropsychological evaluation in 2012, which demonstrated cognitive deficits involving auditory and verbal memory.  Reportedly, some findings were associated with Alzheimer's dementia,  while other findings were not consistent with Alzheimer's dementia.  She had a repeat neuropsychological evaluation in August 2017 which demonstrated findings consistent with Lewy Body Dementia, however she did not exhibit the classic symptoms (such as hallucinations).   Workup included . MRI of brain with and without contrast from 03/02/16 was personally reviewed and was unremarkable. TSH from 03/09/16 was 0.92.   She is currently Exelon patch now 4.6mg /24h.  She previously took Aricept, which caused GI upset.   Of note, she presented to an outside ED in [facility] on 11/28/16 for BPPV.  Report of head CT was unremarkable.  CBC and CMP were reviewed and were unremarkable.   She has 2 years of community college.  She worked as a Dance movement psychotherapist.  She has no history of head trauma.  She has 6-7 drinks a week.  Her mother does not have dementia.  Her father passed away at 72 and did not have dementia up until then.  PAST MEDICAL HISTORY: No past medical history on file.  MEDICATIONS: Current Outpatient Medications on File Prior to Visit  Medication Sig Dispense Refill  . clonazePAM (KLONOPIN) 0.1 mg/mL SUSP Take by mouth.    . doxepin (SINEQUAN) 10 MG capsule   0  . FLUoxetine (PROZAC) 20 MG capsule Take 60 mg by mouth Armstrong.     Marland Kitchen lamoTRIgine (LAMICTAL) 100 MG tablet Take 200 mg by mouth 2 (two) times Armstrong.     . OXcarbazepine (TRILEPTAL) 150 MG tablet Take 150 mg by mouth 2 (two) times Armstrong.    . QUEtiapine (SEROQUEL) 100 MG tablet  Take 25 mg by mouth at bedtime.      No current facility-administered medications on file prior to visit.     ALLERGIES: Allergies  Allergen Reactions  . Erythromycin Rash    FAMILY HISTORY: Family History  Problem Relation Age of Onset  . Bipolar disorder Father   . Bipolar disorder Paternal Grandfather   . Bipolar disorder Daughter     SOCIAL HISTORY: Social History   Socioeconomic History  . Marital status: Married    Spouse name: Not  on file  . Number of children: 3  . Years of education: 69  . Highest education level: Not on file  Social Needs  . Financial resource strain: Not on file  . Food insecurity - worry: Not on file  . Food insecurity - inability: Not on file  . Transportation needs - medical: Not on file  . Transportation needs - non-medical: Not on file  Occupational History  . Not on file  Tobacco Use  . Smoking status: Never Smoker  . Smokeless tobacco: Never Used  Substance and Sexual Activity  . Alcohol use: Yes    Alcohol/week: 2.4 oz    Types: 4 Glasses of wine per week  . Drug use: No  . Sexual activity: Not on file  Other Topics Concern  . Not on file  Social History Narrative   Lives with husband in 2 story home.  Has 3 children.  Education: college.  Retired Dance movement psychotherapist.      REVIEW OF SYSTEMS: Constitutional: No fevers, chills, or sweats, no generalized fatigue, change in appetite Eyes: No visual changes, double vision, eye pain Ear, nose and throat: No hearing loss, ear pain, nasal congestion, sore throat Cardiovascular: No chest pain, palpitations Respiratory:  No shortness of breath at rest or with exertion, wheezes GastrointestinaI: No nausea, vomiting, diarrhea, abdominal pain, fecal incontinence Genitourinary:  No dysuria, urinary retention or frequency Musculoskeletal:  No neck pain, back pain Integumentary: No rash, pruritus, skin lesions Neurological: as above Psychiatric: No depression, insomnia, anxiety Endocrine: No palpitations, fatigue, diaphoresis, mood swings, change in appetite, change in weight, increased thirst Hematologic/Lymphatic:  No purpura, petechiae. Allergic/Immunologic: no itchy/runny eyes, nasal congestion, recent allergic reactions, rashes  PHYSICAL EXAM: Vitals:   04/12/17 1427  BP: (!) 120/58  Pulse: 64  SpO2: 97%   General: No acute distress.  Patient appears well-groomed.  normal body habitus.  IMPRESSION: Depression and anxiety.   No evidence of cognitive impairment  PLAN: 1.  She will discontinue Exelon 2.  She will continue working with psychology/psychiatry 3.  Recommend Mediterranean diet as well as cardiovascular exercise and weight training. 4.  Follow up as needed.  17 minutes spent face to face with patient, over 50% spent discussing test results and recommendations.  Metta Clines, DO  CC:  Margaretha Sheffield, MD

## 2017-04-12 NOTE — Patient Instructions (Signed)
I would stop the [employer] Start cardiovascular exercise and weight training Follow Mediterranean diet  Mediterranean Diet A Mediterranean diet refers to food and lifestyle choices that are based on the traditions of countries located on the [employer]. This way of eating has been shown to help prevent certain conditions and improve outcomes for people who have chronic diseases, like kidney disease and heart disease. What are tips for following this plan? Lifestyle  Cook and eat meals together with your family, when possible.  Drink enough fluid to keep your urine clear or pale yellow.  Be physically active every day. This includes: ? Aerobic exercise like running or swimming. ? Leisure activities like gardening, walking, or housework.  Get 7-8 hours of sleep each night.  If recommended by your health care provider, drink red wine in moderation. This means 1 glass a day for nonpregnant women and 2 glasses a day for men. A glass of wine equals 5 oz (150 mL). Reading food labels  Check the serving size of packaged foods. For foods such as rice and pasta, the serving size refers to the amount of cooked product, not dry.  Check the total fat in packaged foods. Avoid foods that have saturated fat or trans fats.  Check the ingredients list for added sugars, such as corn syrup. Shopping  At the grocery store, buy most of your food from the areas near the walls of the store. This includes: ? Fresh fruits and vegetables (produce). ? Grains, beans, nuts, and seeds. Some of these may be available in unpackaged forms or large amounts (in bulk). ? Fresh seafood. ? Poultry and eggs. ? Low-fat dairy products.  Buy whole ingredients instead of prepackaged foods.  Buy fresh fruits and vegetables in-season from local farmers markets.  Buy frozen fruits and vegetables in resealable bags.  If you do not have access to quality fresh seafood, buy precooked frozen shrimp or canned fish, such  as tuna, salmon, or sardines.  Buy small amounts of raw or cooked vegetables, salads, or olives from the deli or salad bar at your store.  Stock your pantry so you always have certain foods on hand, such as olive oil, canned tuna, canned tomatoes, rice, pasta, and beans. Cooking  Cook foods with extra-virgin olive oil instead of using butter or other vegetable oils.  Have meat as a side dish, and have vegetables or grains as your main dish. This means having meat in small portions or adding small amounts of meat to foods like pasta or stew.  Use beans or vegetables instead of meat in common dishes like chili or lasagna.  Experiment with different cooking methods. Try roasting or broiling vegetables instead of steaming or sauteing them.  Add frozen vegetables to soups, stews, pasta, or rice.  Add nuts or seeds for added healthy fat at each meal. You can add these to yogurt, salads, or vegetable dishes.  Marinate fish or vegetables using olive oil, lemon juice, garlic, and fresh herbs. Meal planning  Plan to eat 1 vegetarian meal one day each week. Try to work up to 2 vegetarian meals, if possible.  Eat seafood 2 or more times a week.  Have healthy snacks readily available, such as: ? Vegetable sticks with hummus. ? [place] yogurt. ? Fruit and nut trail mix.  Eat balanced meals throughout the week. This includes: ? Fruit: 2-3 servings a day ? Vegetables: 4-5 servings a day ? Low-fat dairy: 2 servings a day ? Fish, poultry, or lean meat: 1  serving a day ? Beans and legumes: 2 or more servings a week ? Nuts and seeds: 1-2 servings a day ? Whole grains: 6-8 servings a day ? Extra-virgin olive oil: 3-4 servings a day  Limit red meat and sweets to only a few servings a month What are my food choices?  Mediterranean diet ? Recommended ? Grains: Whole-grain pasta. Brown rice. Bulgar wheat. Polenta. Couscous. Whole-wheat bread. [place]. ? Vegetables: Artichokes. Beets.  Broccoli. Cabbage. Carrots. Eggplant. Green beans. Chard. Kale. Spinach. Onions. Leeks. Peas. Squash. Tomatoes. Peppers. Radishes. ? Fruits: Apples. Apricots. Avocado. Berries. Bananas. Cherries. Dates. Figs. Grapes. Lemons. Melon. Oranges. Peaches. Plums. Pomegranate. ? Meats and other protein foods: Beans. Almonds. Sunflower seeds. Pine nuts. Peanuts. [place]. Salmon. Scallops. Shrimp. [place]. Tilapia. Clams. Oysters. Eggs. ? Dairy: Low-fat milk. Cheese. Greek yogurt. ? Beverages: Water. Red wine. Herbal tea. ? Fats and oils: Extra virgin olive oil. Avocado oil. Grape seed oil. ? Sweets and desserts: [place] yogurt with honey. Baked apples. Poached pears. Trail mix. ? Seasoning and other foods: Basil. Cilantro. Coriander. Cumin. Mint. Parsley. Sage. Rosemary. Tarragon. Garlic. Oregano. Thyme. Pepper. Balsalmic vinegar. Tahini. Hummus. Tomato sauce. Olives. Mushrooms. ? Limit these ? Grains: Prepackaged pasta or rice dishes. Prepackaged cereal with added sugar. ? Vegetables: Deep fried potatoes (french fries). ? Fruits: Fruit canned in syrup. ? Meats and other protein foods: Beef. Pork. Lamb. Poultry with skin. Hot dogs. Berniece Salines. ? Dairy: Ice cream. Sour cream. Whole milk. ? Beverages: Juice. Sugar-sweetened soft drinks. Beer. Liquor and spirits. ? Fats and oils: Butter. Canola oil. Vegetable oil. Beef fat (tallow). Lard. ? Sweets and desserts: Cookies. Cakes. Pies. Candy. ? Seasoning and other foods: Mayonnaise. Premade sauces and marinades. ? The items listed may not be a complete list. Talk with your dietitian about what dietary choices are right for you. Summary  The Mediterranean diet includes both food and lifestyle choices.  Eat a variety of fresh fruits and vegetables, beans, nuts, seeds, and whole grains.  Limit the amount of red meat and sweets that you eat.  Talk with your health care provider about whether it is safe for you to drink red wine in moderation. This means 1 glass a day for  nonpregnant women and 2 glasses a day for men. A glass of wine equals 5 oz (150 mL). This information is not intended to replace advice given to you by your health care provider. Make sure you discuss any questions you have with your health care provider. Document Released: 01/09/2016 Document Revised: 02/11/2016 Document Reviewed: 01/09/2016 Elsevier Interactive Patient Education  [employer].

## 2017-04-13 ENCOUNTER — Encounter: Payer: Self-pay | Admitting: Psychology

## 2017-04-15 ENCOUNTER — Ambulatory Visit: Payer: Self-pay | Admitting: Neurology

## 2017-05-06 ENCOUNTER — Encounter: Payer: Self-pay | Admitting: Internal Medicine

## 2020-08-22 ENCOUNTER — Encounter: Payer: Self-pay | Admitting: Neurology

## 2020-10-02 NOTE — Progress Notes (Signed)
NEUROLOGY CONSULTATION NOTE  [name] MRN: [id number] DOB: 1955/09/08  Referring provider: Margaretha Sheffield, MD Primary care provider: Margaretha Sheffield, MD  Reason for consult:  Muscle twitching  Assessment/Plan:   1.  Transient episode of muscle spasm - unclear etiology 2.  Transient episode of full-body paresthesias - unclear etiology 3.  Cognitive complaints.  Prior testing demonstrated findings of depression but no organic cognitive impairment.  Reports progression of symptoms which warrants repeat testing.  1.  MRI of brain and cervical spine 2.  Check B12 and TSH 3.  Neuropsychological testing 4.  Follow up after testing.   Subjective:  Robin Armstrong is a 65 year old female with major depressive disorder who presents for muscle twitching.  History supplemented by referring provider's note.  She is accompanied by her husband who also provides history.    On 06/02/2020, she was in [place] sitting and reading the newspaper when she suddenly she experienced spasms and shaking involving the chest, then spreading to the arms and legs.  She had associated chest pain as well.  No loss of consciousness, numbness, facial droop or slurred speech.  Spasms occurred off and on for 2 hours.  She went to a local ED that day.  CT head showed no acute intracranial process.  Labs, including etoh, troponin, BNP, CBC and CMP, were unremarkable.  EKG showed no acute coronary changes.    Afterwards, she denied weakness or muscle soreness.  No previous episodes.   Then yesterday, she reported having numbness and tingling of her entire body including face, torso and extremities.  When she closed her eyes, she saw little specks of light.  Only change in medications over the past couple of months was adding buspirione and atorvastatin.  In 2011 or 2012, she started experiencing problems with cognition.  At first, she had trouble with balance.  She would sometimes feel unsteady when she first stood up and  started walking.  She also had increased agitation.  She developed intention and kinetic tremors in both hands.  Sometimes her leg may tremor as well.  She started to to have more difficulty making decisions.  It started taking her longer to multitask, such as cooking.  She reports difficulty remembering names of familiar people, such as friends.  She often misplaces objects.  Sometimes she can't remember what she did that day, such as taking a shower or her medications.  She reports word-finding difficulty and sometimes makes paraphasic errors.  She has limited her driving because she has gotten lost on familiar routes.  Now she only drives locally in town.  She says she sometimes stares off for a few seconds.  She loses track of her thoughts.  She is easily distracted.  She reports fatigue.  She denies hallucinations.  She is able to perform basic ADLs.  She has longstanding history of major depression with prior suicide attempts, however she says her depression has been well-controlled for several years.  She sleeps well with Seroquel.  She underwent a neuropsychological evaluation in 2012, which demonstrated cognitive deficits involving auditory and verbal memory.  Reportedly, some findings were associated with Alzheimer's dementia, while other findings were not consistent with Alzheimer's dementia.  She had a repeat neuropsychological evaluation in August 2017 which demonstrated findings consistent with Lewy Body Dementia, however she did not exhibit the classic symptoms (such as hallucinations).  I had previously seen her for cognitive deficits in 2018.  At that time, B12 level was 274 and she  was advised to start supplementation.   She underwent neuropsychological testing on 03/22/17.  She performed within normal limits and while she demonstrated symptoms of depression and anxiety, she did not demonstrate any cognitive impairment.  She and her husband are still concerned about her memory and report that  memory has gotten worse over the past several years.  For depression and mood, she takes Prozac, lamotrigine, buspirone, Seroquel and Trileptal but no recent changes or new medications added.      PAST MEDICAL HISTORY: Major depressive disorder  PAST SURGICAL HISTORY: Past Surgical History:  Procedure Laterality Date  . BREAST ENHANCEMENT SURGERY  1985  . KNEE SURGERY    . SYNOVIAL CYST EXCISION    . TONSILLECTOMY      MEDICATIONS: Current Outpatient Medications on File Prior to Visit  Medication Sig Dispense Refill  . clonazePAM (KLONOPIN) 0.1 mg/mL SUSP Take by mouth.    . doxepin (SINEQUAN) 10 MG capsule   0  . FLUoxetine (PROZAC) 20 MG capsule Take 60 mg by mouth daily.     Marland Kitchen lamoTRIgine (LAMICTAL) 100 MG tablet Take 200 mg by mouth 2 (two) times daily.     . OXcarbazepine (TRILEPTAL) 150 MG tablet Take 150 mg by mouth 2 (two) times daily.    . QUEtiapine (SEROQUEL) 100 MG tablet Take 25 mg by mouth at bedtime.      No current facility-administered medications on file prior to visit.    ALLERGIES: Allergies  Allergen Reactions  . Erythromycin Rash    FAMILY HISTORY: Family History  Problem Relation Age of Onset  . Bipolar disorder Father   . Bipolar disorder Paternal Grandfather   . Bipolar disorder Daughter     Objective:  Blood pressure 109/71, pulse 67, resp. rate 18, height 5\' 4"  (1.626 m), weight 134 lb (60.8 kg), SpO2 97 %. General: No acute distress.  Patient appears well-groomed.   Head:  Normocephalic/atraumatic Eyes:  fundi examined but not visualized Neck: supple, no paraspinal tenderness, full range of motion Back: No paraspinal tenderness Heart: regular rate and rhythm Lungs: Clear to auscultation bilaterally. Vascular: No carotid bruits. Neurological Exam: Mental status: alert and oriented to person, place, and time, recent and remote memory intact, fund of knowledge intact, attention and concentration intact, speech fluent and not dysarthric,  language intact. Cranial nerves: CN I: not tested CN II: pupils equal, round and reactive to light, visual fields intact CN III, IV, VI:  full range of motion, no nystagmus, no ptosis CN V: facial sensation intact. CN VII: upper and lower face symmetric CN VIII: hearing intact CN IX, X: gag intact, uvula midline CN XI: sternocleidomastoid and trapezius muscles intact CN XII: tongue midline Bulk & Tone: normal, no fasciculations. Motor:  muscle strength 5/5 throughout Sensation:  Pinprick, temperature and vibratory sensation intact. Deep Tendon Reflexes:  3+ throughout,  toes downgoing.   Finger to nose testing:  Without dysmetria.   Heel to shin:  Without dysmetria.   Gait:  Normal station and stride.  Romberg negative.    Thank you for allowing me to take part in the care of this patient.  Metta Clines, DO  CC: Margaretha Sheffield, MD

## 2020-10-03 ENCOUNTER — Other Ambulatory Visit: Payer: Self-pay

## 2020-10-03 ENCOUNTER — Other Ambulatory Visit (INDEPENDENT_AMBULATORY_CARE_PROVIDER_SITE_OTHER): Payer: Medicare Other

## 2020-10-03 ENCOUNTER — Encounter: Payer: Self-pay | Admitting: Neurology

## 2020-10-03 ENCOUNTER — Ambulatory Visit (INDEPENDENT_AMBULATORY_CARE_PROVIDER_SITE_OTHER): Payer: Medicare Other | Admitting: Neurology

## 2020-10-03 VITALS — BP 109/71 | HR 67 | Resp 18 | Ht 64.0 in | Wt 134.0 lb

## 2020-10-03 DIAGNOSIS — R200 Anesthesia of skin: Secondary | ICD-10-CM

## 2020-10-03 DIAGNOSIS — M62838 Other muscle spasm: Secondary | ICD-10-CM

## 2020-10-03 DIAGNOSIS — R202 Paresthesia of skin: Secondary | ICD-10-CM

## 2020-10-03 DIAGNOSIS — R292 Abnormal reflex: Secondary | ICD-10-CM

## 2020-10-03 DIAGNOSIS — R413 Other amnesia: Secondary | ICD-10-CM

## 2020-10-03 LAB — VITAMIN B12: Vitamin B-12: 847 pg/mL (ref 211–911)

## 2020-10-03 LAB — TSH: TSH: 1.12 u[IU]/mL (ref 0.35–4.50)

## 2020-10-03 NOTE — Patient Instructions (Addendum)
1.  MRI of brain and cervical spine  2.  B12, TSH 3.  Neurocognitive testing 4.  Follow up after testing.  Your provider has requested that you have labwork completed today. Please go to [facility] ([id number]) on the second floor of this building before leaving the office today. You do not need to check in. If you are not called within 15 minutes please check with the front desk.   We have sent a referral to [facility] for your MRI and they will call you directly to schedule your appointment. They are located at [address] [place]. If you need to contact them directly please call [phone].

## 2020-10-08 NOTE — Progress Notes (Signed)
MEMBER NUMBER [id number] NO PA IS NEEDED

## 2020-10-18 ENCOUNTER — Ambulatory Visit
Admission: RE | Admit: 2020-10-18 | Discharge: 2020-10-18 | Disposition: A | Discharge status: Home / Self Care | Payer: Medicare Other | Source: Ambulatory Visit | Attending: Neurology | Admitting: Neurology

## 2020-10-18 ENCOUNTER — Other Ambulatory Visit: Payer: Self-pay

## 2020-10-18 IMAGING — MR MR CERVICAL SPINE W/O CM
4 of 5 series · 28 of 48 positions shown · non-contrast
Comparison: No pertinent prior exams available for comparison.

CLINICAL DATA: Provided history: Muscle spasms of both lower
extremities. Numbness and tingling. Hyper reflexia. Memory deficits.
Additional history provided by scanning technologist: Patient
reports "passing out" [DATE]. Transient episode of muscle spasm.
Transient episode of full-body paresthesias.

EXAM:
MRI CERVICAL SPINE WITHOUT CONTRAST
TECHNIQUE: Multiplanar, multisequence MR imaging of the cervical spine was
performed. No intravenous contrast was administered.

[Series 3: T2 · sagittal · 3.0mm · 0.66mm/px · 8 of 18 slices shown (1 of 2)]
[im 1/18]
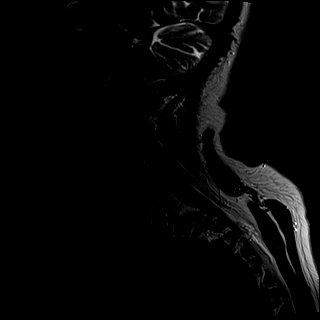
[im 3/18]
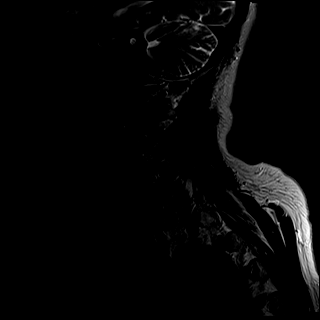
[im 5/18]
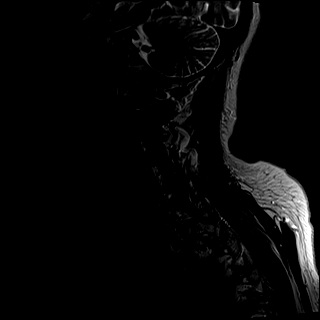
[im 8/18]
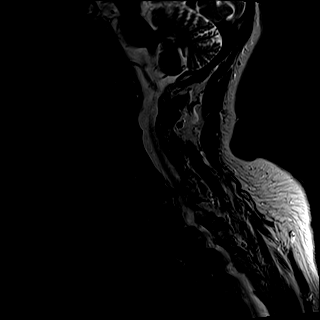
[im 10/18]
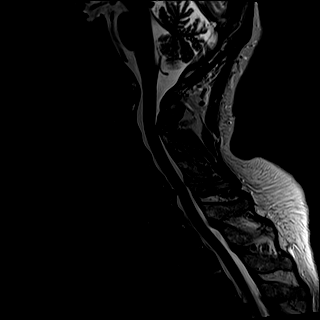
[im 13/18]
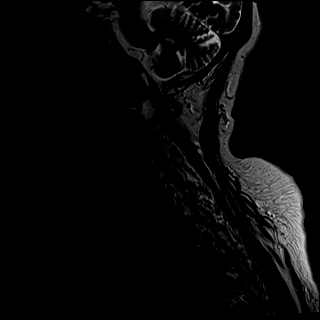
[im 15/18]
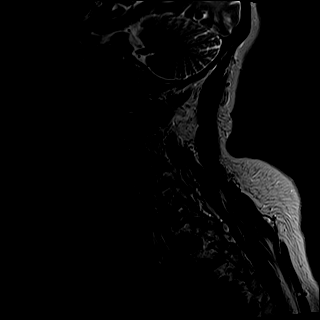
[im 18/18]
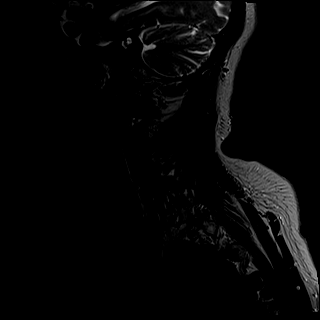

[Series 4: T1 · sagittal · 3.0mm · 0.41mm/px · 7 of 18 slices shown]
[im 1/18]
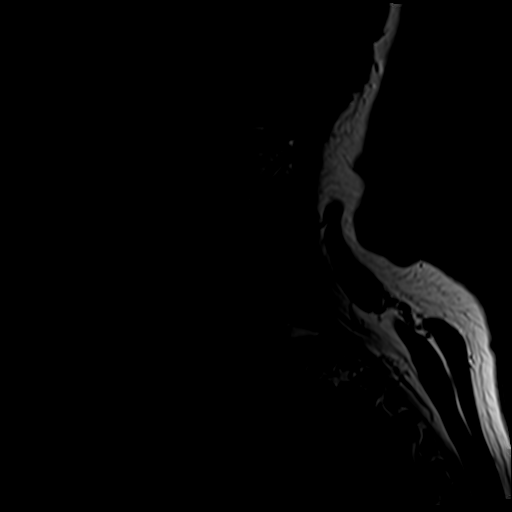
[im 3/18]
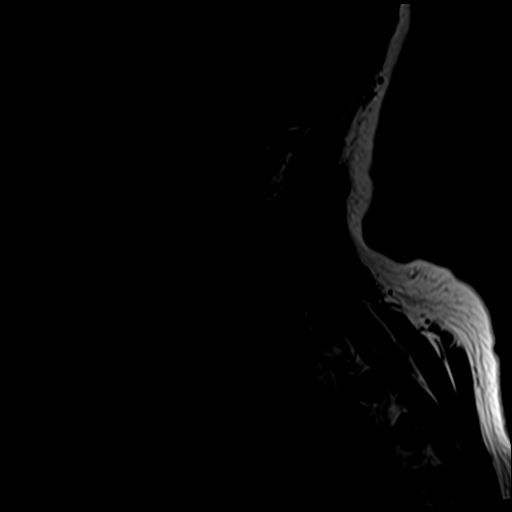
[im 6/18]
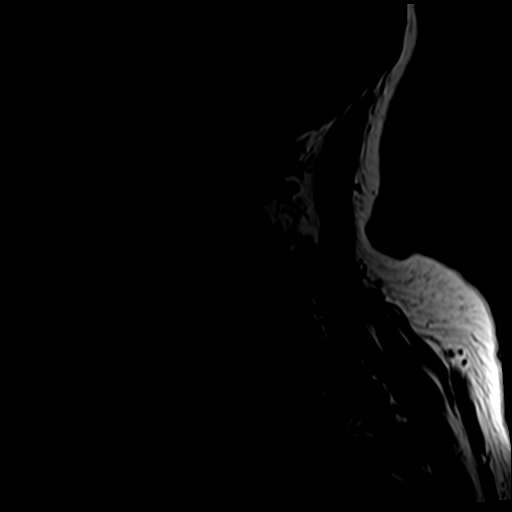
[im 9/18]
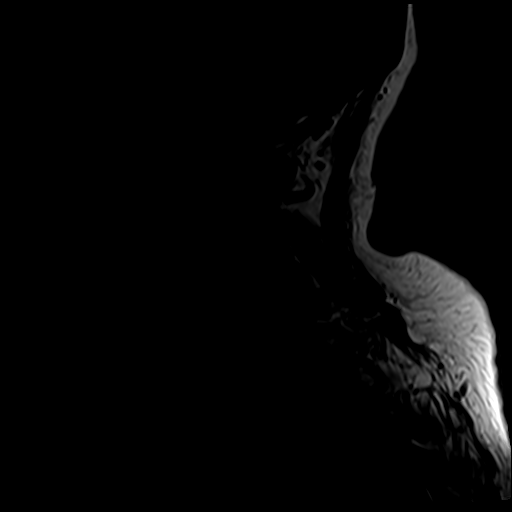
[im 12/18]
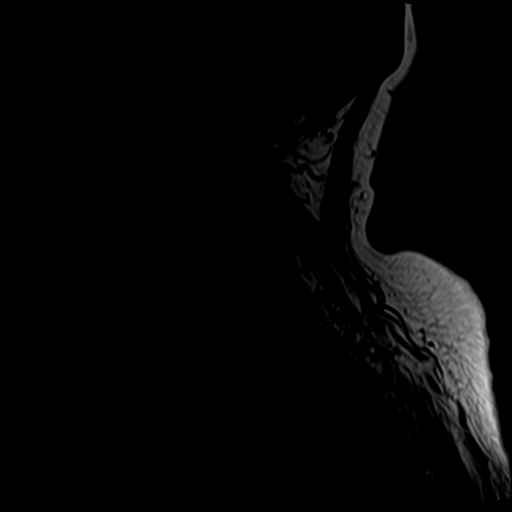
[im 15/18]
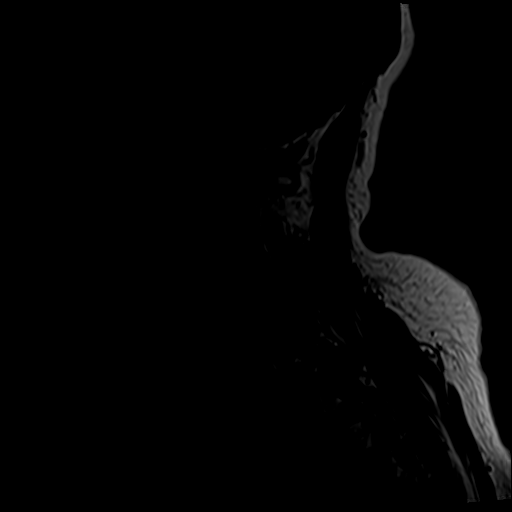
[im 18/18]
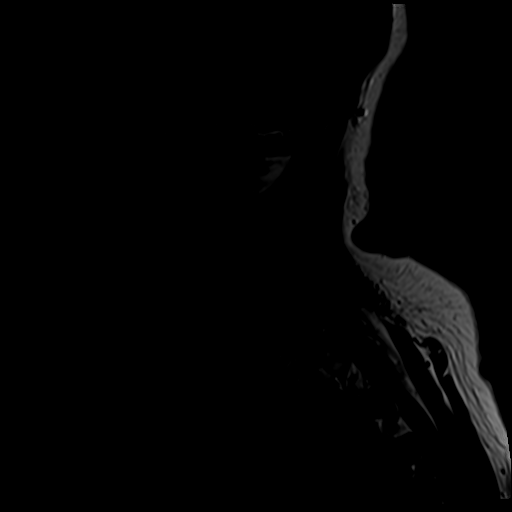

[Series 5: tir sag · sagittal · 3.0mm · 0.41mm/px · 4 of 18 slices shown]
[im 1/18]
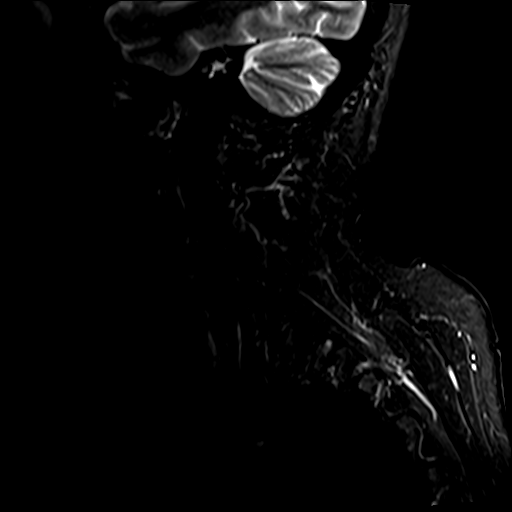
[im 3/18]
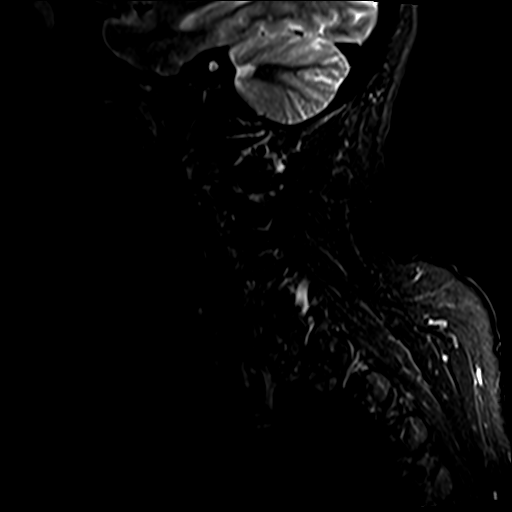
[im 9/18]
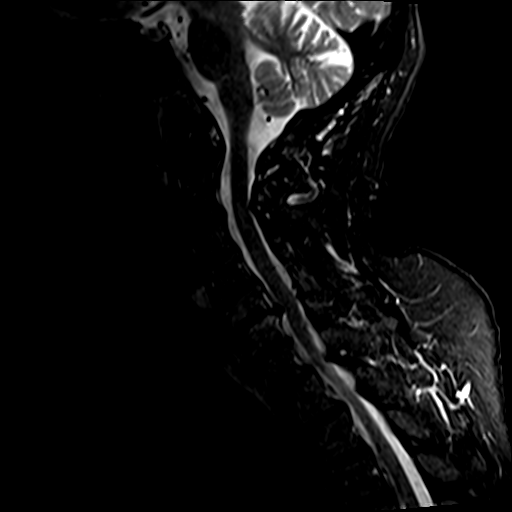
[im 15/18]
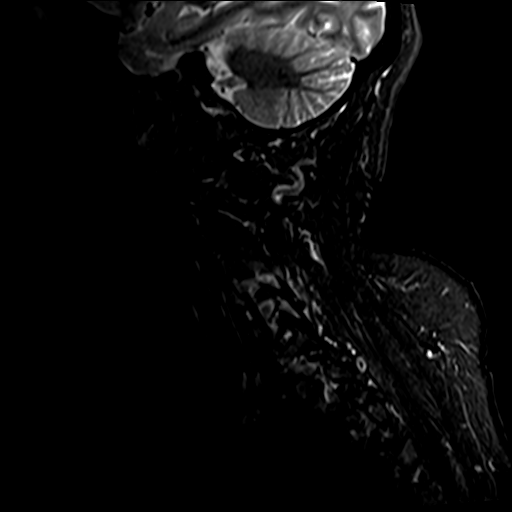

[Series 7: T2 · axial · 3.0mm · 0.70mm/px · z∈[-188,-77]mm · 9 of 34 slices shown (2 of 2)]
[im 1/34]
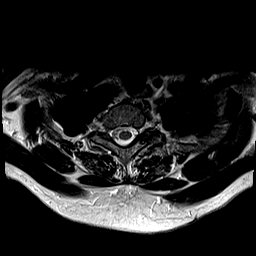
[im 6/34]
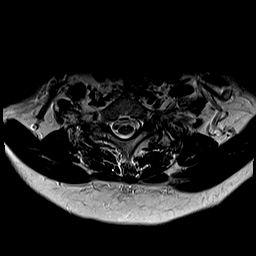
[im 12/34]
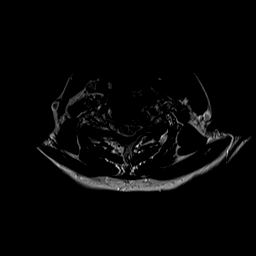
[im 14/34]
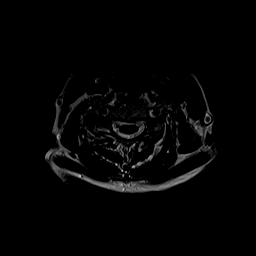
[im 17/34]
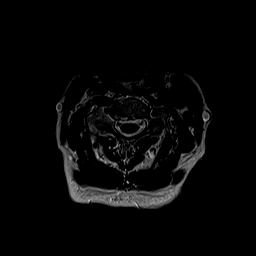
[im 20/34]
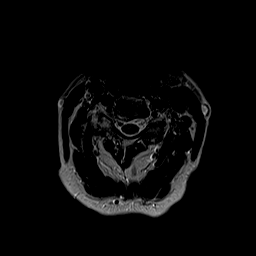
[im 23/34]
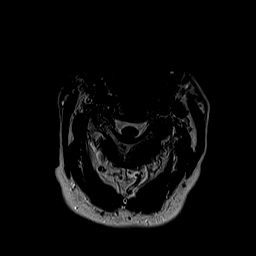
[im 28/34]
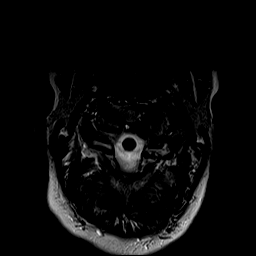
[im 34/34]
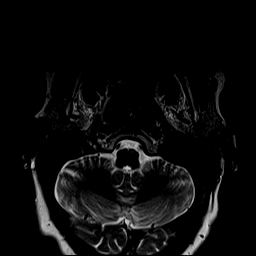

[28 of 48 positions shown; findings below may reference images not displayed]

FINDINGS: Alignment: Straightening of the expected cervical lordosis. Trace
C4-C5 grade 1 anterolisthesis. 2 mm C7-T1 grade 1 anterolisthesis. 2
mm T1-T2 grade 1 anterolisthesis. Trace T2-T3 grade 1
anterolisthesis.

Vertebrae: Vertebral body height is maintained. Mild degenerative
endplate irregularity and degenerative endplate edema at C5-C6.
Degenerative edema is also present within the right greater than
left posterior elements at C5-C6. Elsewhere, no significant marrow
edema or focal suspicious osseous lesion is identified.

Cord: No spinal cord signal abnormality is identified.

Posterior Fossa, vertebral arteries, paraspinal tissues: Posterior
fossa better assessed on same-day brain MRI. Flow voids preserved
within the imaged cervical vertebral arteries. Paraspinal soft
tissues within normal limits.

Disc levels:

Moderate disc degeneration at C5-C6, C6-C7, C7-T1 and T1-T2.
Mild-to-moderate disc degeneration at the remaining visible levels.

C2-C3: Posterior disc osteophyte complex with bilateral disc
osteophyte ridge/uncinate hypertrophy. Facet arthrosis (greater on
the left). Mild ligamentum flavum hypertrophy. No significant spinal
canal stenosis. Mild/moderate right neural foraminal narrowing.

C3-C4: Right-sided disc osteophyte ridge/uncinate hypertrophy. Mild
partial effacement of the ventral thecal sac on the right without
spinal cord mass effect. Mild right neural foraminal narrowing.

C4-C5: Trace grade 1 anterolisthesis. Slight disc uncovering. Mild
uncinate hypertrophy. No significant spinal canal or foraminal
stenosis.

C5-C6: Posterior disc osteophyte complex with bilateral disc
osteophyte ridge/uncinate hypertrophy. Facet arthrosis (greater on
the left). Mild ligamentum flavum hypertrophy. Moderate spinal canal
stenosis. The posterior disc osteophyte complex contacts and mildly
flattens the ventral spinal cord. Severe bilateral neural foraminal
narrowing.

C6-C7: Posterior disc osteophyte complex. Endplate spurring with
right greater than left disc osteophyte ridge/uncinate hypertrophy.
Facet arthrosis (greater on the left). No significant spinal canal
stenosis. Bilateral neural foraminal narrowing (moderate/severe
right, mild left).

C7-T1: Grade 1 anterolisthesis. Disc uncovering. Hypertrophic facet
arthrosis (prominent on the left). Ligamentum flavum hypertrophy. No
significant spinal canal stenosis. Mild/moderate left neural
foraminal narrowing.
IMPRESSION: Cervical spondylosis, as outlined and with findings most notably as
follows.

At C5-C6, there is moderate disc degeneration. Posterior disc
osteophyte complex with uncinate hypertrophy, facet arthrosis and
ligamentum flavum hypertrophy. Moderate spinal canal stenosis. The
disc osteophyte complex contacts and mildly flattens the ventral
spinal cord. Severe bilateral neural foraminal narrowing. Mild
degenerative endplate edema. Degenerative edema is also present
within the right greater than left posterior elements at this level.

No more than mild relative spinal canal narrowing at the remaining
levels.

Additional sites of neural foraminal narrowing as detailed and,
greatest on the right at C6-C7 (moderate/severe).

Mild multilevel grade 1 spondylolisthesis.

## 2020-10-18 IMAGING — MR MR HEAD W/O CM
10 series · 48 of 48 positions shown · non-contrast
Comparison: No pertinent prior exams available for comparison.

CLINICAL DATA: Memory deficits. Memory loss; memory deficits.
Additional history provided by scanning technologist: Patient
reports "passing out" [DATE]. Transient episode of muscle spasm.
Transient episode of full-body paresthesias.

EXAM:
MRI HEAD WITHOUT CONTRAST
TECHNIQUE: Multiplanar, multiecho pulse sequences of the brain and surrounding
structures were obtained without intravenous contrast.

[Series 2: T1 · sagittal · 5.0mm · 0.45mm/px · 3 of 25 slices shown]
[im 1/25]
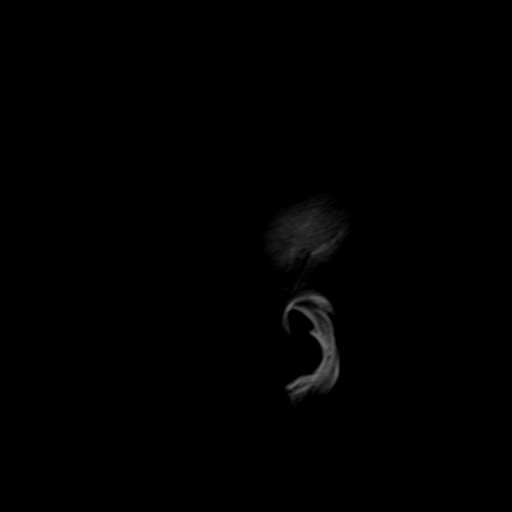
[im 13/25]
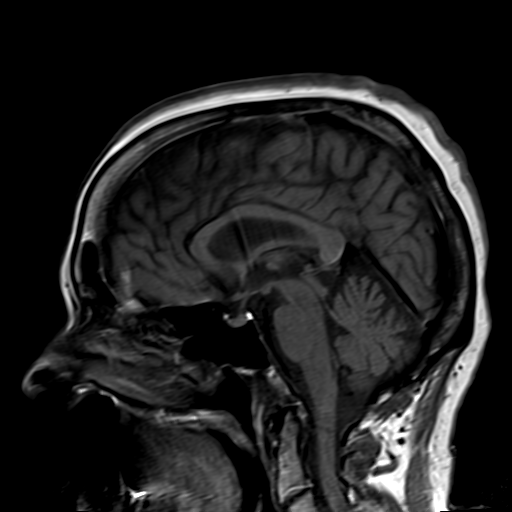
[im 25/25]
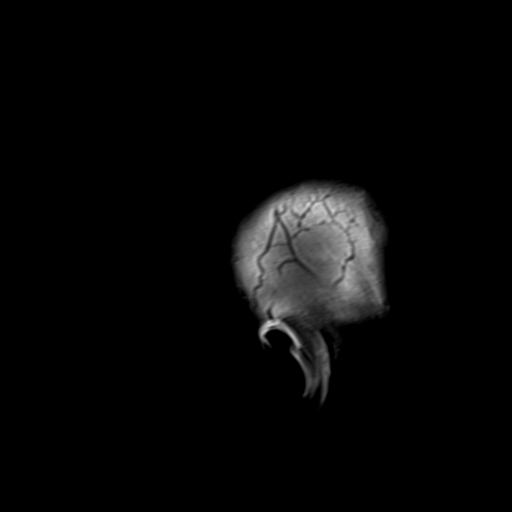

[Series 3: ax ep2d_diff_3 · axial · 3.0mm · 1.80mm/px · z∈[-14,+141]mm · 8 of 98 slices shown]
[im 1/98]
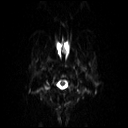
[im 14/98]
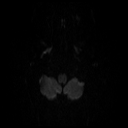
[im 28/98]
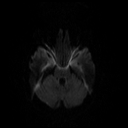
[im 42/98]
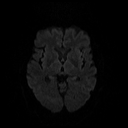
[im 56/98]
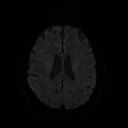
[im 70/98]
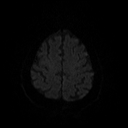
[im 84/98]
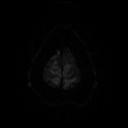
[im 98/98]
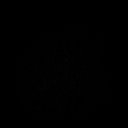

[Series 4: ax ep2d_diff_3_adc · axial · 3.0mm · 1.80mm/px · z∈[-14,+144]mm · 4 of 54 slices shown]
[im 1/54]
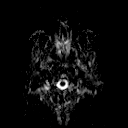
[im 18/54]
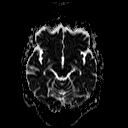
[im 36/54]
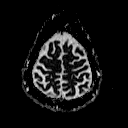
[im 54/54]
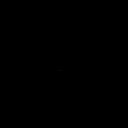

[Series 5: cor ep2d_diff · coronal · 5.0mm · 1.77mm/px · 4 of 55 slices shown]
[im 1/55]
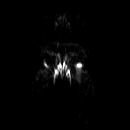
[im 19/55]
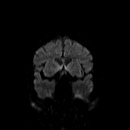
[im 37/55]
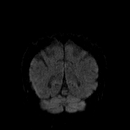
[im 55/55]
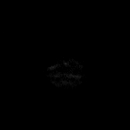

[Series 6: cor ep2d_diff_adc · coronal · 5.0mm · 1.77mm/px · 2 of 28 slices shown]
[im 1/28]
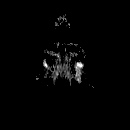
[im 28/28]
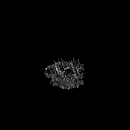

[Series 8: swi_images · axial · 2.0mm · 0.98mm/px · z∈[-58,+100]mm · 6 of 80 slices shown]
[im 1/80]
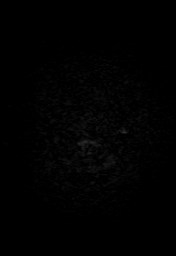
[im 16/80]
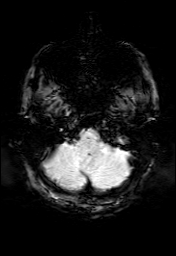
[im 32/80]
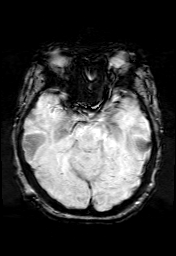
[im 48/80]
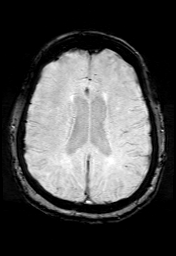
[im 64/80]
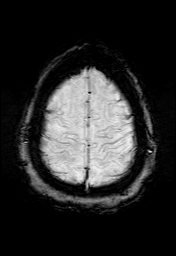
[im 80/80]
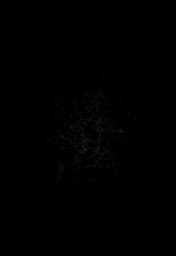

[Series 9: FLAIR · axial · 3.0mm · 0.43mm/px · z∈[-54,+102]mm · 3 of 41 slices shown]
[im 1/41]
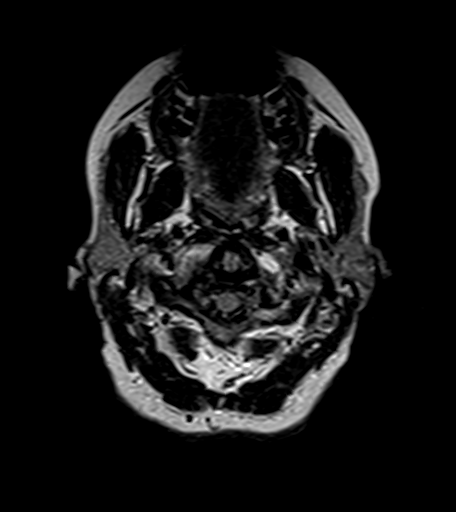
[im 21/41]
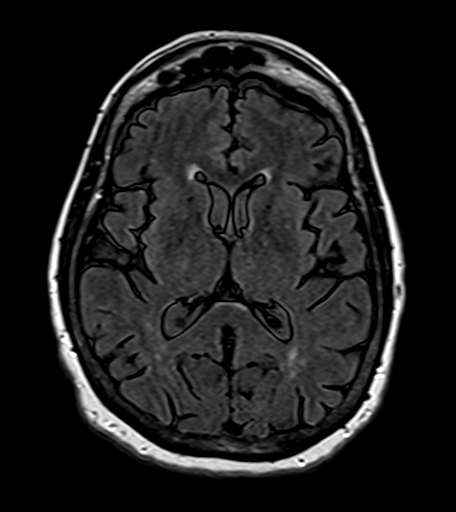
[im 41/41]
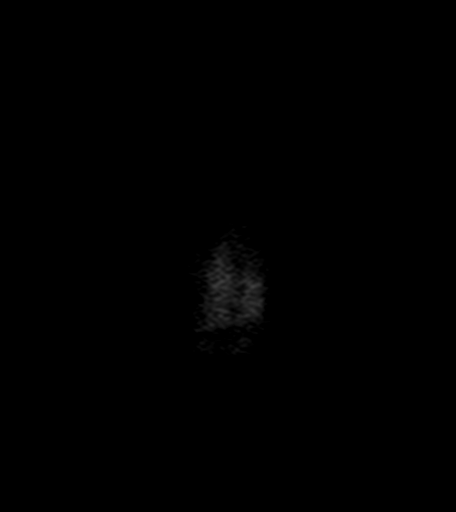

[Series 10: T2 · axial · 5.0mm · 0.65mm/px · z∈[-60,+108]mm · 2 of 29 slices shown (1 of 2)]
[im 1/29]
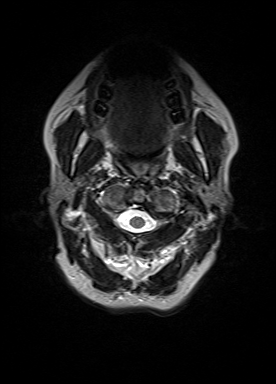
[im 29/29]
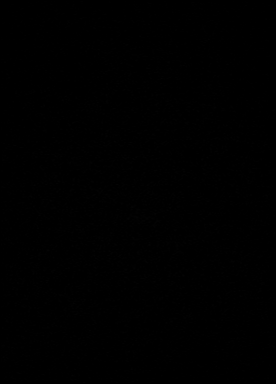

[Series 11: t1_mpr_tra · axial · 1.0mm · 0.72mm/px · z∈[-64,+111]mm · 14 of 175 slices shown]
[im 1/175]
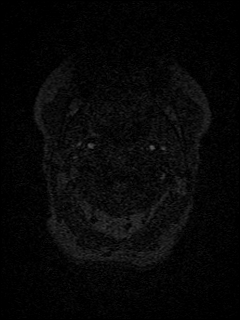
[im 14/175]
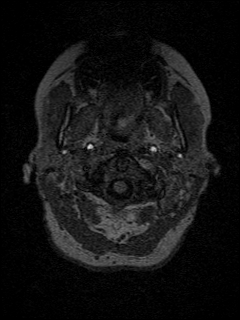
[im 27/175]
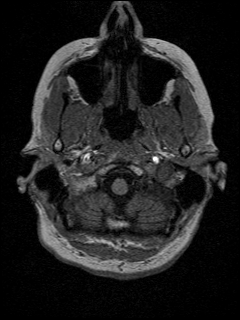
[im 41/175]
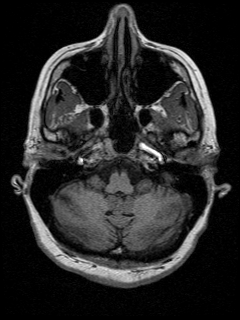
[im 54/175]
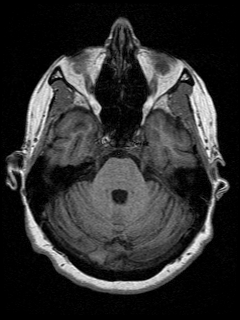
[im 67/175]
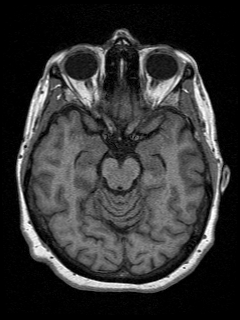
[im 81/175]
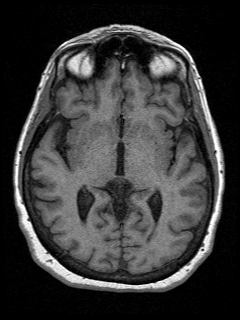
[im 94/175]
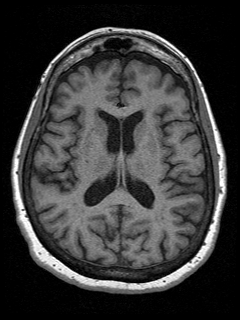
[im 108/175]
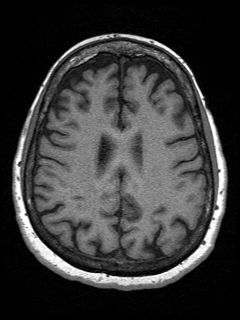
[im 121/175]
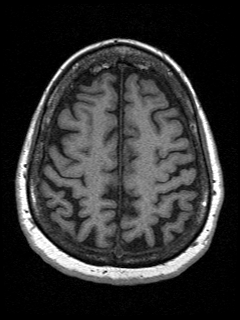
[im 134/175]
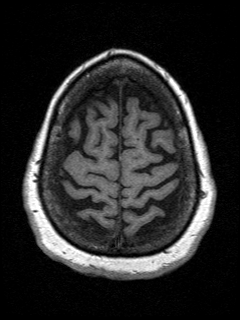
[im 148/175]
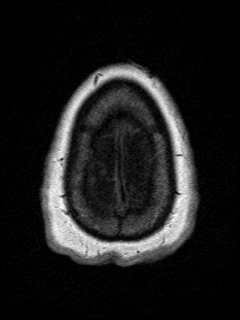
[im 161/175]
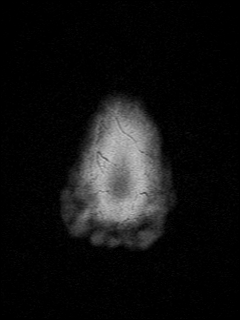
[im 175/175]
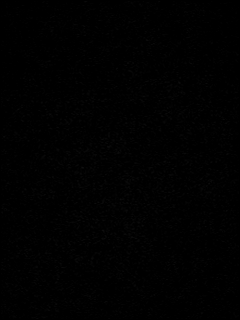

[Series 12: T2 · coronal · 5.0mm · 0.45mm/px · 2 of 31 slices shown (2 of 2)]
[im 1/31]
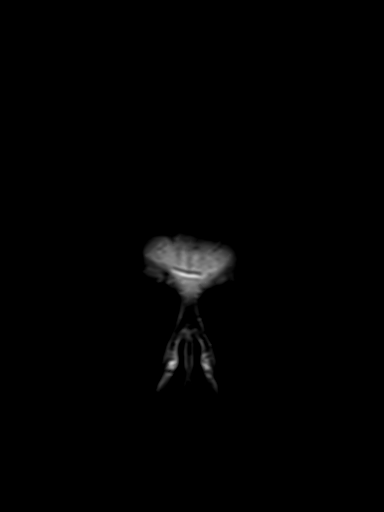
[im 31/31]
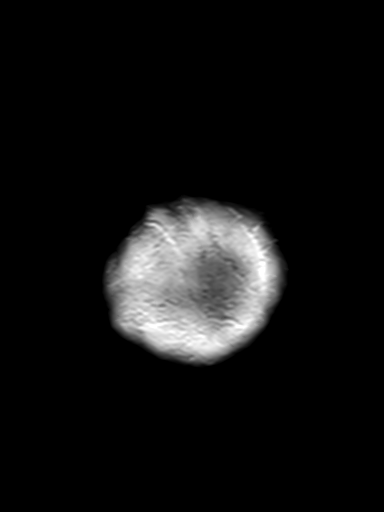

[48 of 48 positions shown; findings below may reference images not displayed]

FINDINGS: Brain:

Mild-to-moderate cerebral atrophy without appreciable lobar
predominance. Comparatively mild cerebellar atrophy.

Mild multifocal T2/FLAIR hyperintensity within the cerebral white
matter, nonspecific but compatible with chronic small vessel
ischemic disease.

There is no acute infarct.

No evidence of intracranial mass.

No chronic intracranial blood products.

No extra-axial fluid collection.

No midline shift.

Vascular: Expected proximal arterial flow voids.

Skull and upper cervical spine: No focal suspicious marrow lesion.

Sinuses/Orbits: Visualized orbits show no acute finding. Bilateral
lens replacements. Trace bilateral ethmoid sinus mucosal thickening.
IMPRESSION: No evidence of acute intracranial abnormality.

Mild chronic small vessel ischemic changes within the cerebral white
matter.

Mild-to-moderate generalized cerebral atrophy. Comparatively mild
cerebellar atrophy.

## 2020-10-21 NOTE — Progress Notes (Signed)
LM with husband to call the office back.

## 2020-10-21 NOTE — Progress Notes (Signed)
Pt advised.

## 2020-10-25 ENCOUNTER — Ambulatory Visit: Payer: BLUE CROSS/BLUE SHIELD | Admitting: Neurology

## 2020-11-06 ENCOUNTER — Encounter: Payer: Self-pay | Admitting: Physician Assistant

## 2020-11-06 ENCOUNTER — Other Ambulatory Visit: Payer: Self-pay

## 2020-11-06 ENCOUNTER — Other Ambulatory Visit: Payer: Self-pay | Admitting: Physician Assistant

## 2020-11-06 ENCOUNTER — Ambulatory Visit (INDEPENDENT_AMBULATORY_CARE_PROVIDER_SITE_OTHER): Payer: Medicare Other | Admitting: Physician Assistant

## 2020-11-06 DIAGNOSIS — L309 Dermatitis, unspecified: Secondary | ICD-10-CM

## 2020-11-06 DIAGNOSIS — L570 Actinic keratosis: Secondary | ICD-10-CM

## 2020-11-06 DIAGNOSIS — Z85820 Personal history of malignant melanoma of skin: Secondary | ICD-10-CM | POA: Diagnosis not present

## 2020-11-06 DIAGNOSIS — Z1283 Encounter for screening for malignant neoplasm of skin: Secondary | ICD-10-CM

## 2020-11-06 MED ORDER — CLOBETASOL PROPIONATE 0.05 % EX SOLN
1.0000 | Freq: Two times a day (BID) | CUTANEOUS | 11 refills | Status: DC
Start: 2020-11-06 — End: 2020-11-06

## 2020-11-06 NOTE — Progress Notes (Signed)
   Follow-Up Visit   Subjective  Robin Armstrong is a 65 y.o. female who presents for the following: Annual Exam (No new concerns- Personal history of melanoma- no personal history of non mole skin cancers. Patient denies any family history of melanoma or non mole skin cancer.  ).   The following portions of the chart were reviewed this encounter and updated as appropriate:      Objective  Well appearing patient in no apparent distress; mood and affect are within normal limits.  A full examination was performed including scalp, head, eyes, ears, nose, lips, neck, chest, axillae, abdomen, back, buttocks, bilateral upper extremities, bilateral lower extremities, hands, feet, fingers, toes, fingernails, and toenails. All findings within normal limits unless otherwise noted below.  Objective  Right Breast: Erythematous patches with gritty scale.  Objective  Scalp: Small papules   Assessment & Plan  AK (actinic keratosis) Right Breast  Destruction of lesion - Right Breast Complexity: simple   Destruction method: cryotherapy   Informed consent: discussed and consent obtained   Timeout:  patient name, date of birth, surgical site, and procedure verified Lesion destroyed using liquid nitrogen: Yes   Cryotherapy cycles:  3 Outcome: patient tolerated procedure well with no complications   Post-procedure details: wound care instructions given    Dermatitis Scalp  clobetasol (TEMOVATE) 0.05 % external solution - Scalp    I, Hermes Wafer, PA-C, have reviewed all documentation's for this visit.  The documentation on 11/06/20 for the exam, diagnosis, procedures and orders are all accurate and complete.

## 2020-12-30 ENCOUNTER — Encounter: Payer: Medicare Other | Admitting: Counselor

## 2021-01-16 ENCOUNTER — Encounter: Payer: Medicare Other | Admitting: Counselor

## 2021-01-24 ENCOUNTER — Ambulatory Visit (INDEPENDENT_AMBULATORY_CARE_PROVIDER_SITE_OTHER): Payer: Medicare Other | Admitting: Counselor

## 2021-01-24 ENCOUNTER — Other Ambulatory Visit: Payer: Self-pay

## 2021-01-24 ENCOUNTER — Encounter: Payer: Self-pay | Admitting: Counselor

## 2021-01-24 DIAGNOSIS — F418 Other specified anxiety disorders: Secondary | ICD-10-CM

## 2021-01-24 DIAGNOSIS — G3184 Mild cognitive impairment, so stated: Secondary | ICD-10-CM
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
## 2021-01-27 NOTE — Progress Notes (Signed)
[facility]  Patient Name: Robin Armstrong MRN: [id number] Date of Birth: [birth year] Age: 65 y.o. Education: 14 years  Measurement properties of test scores: IQ, Index, and Standard Scores (SS): Mean = 100; Standard Deviation = 15 Scaled Scores (Ss): Mean = 10; Standard Deviation = 3 Z scores (Z): Mean = 0; Standard Deviation = 1 T scores (T); Mean = 50; Standard Deviation = 10  TEST SCORES:    Note: This summary of test scores accompanies the interpretive report and should not be interpreted by unqualified individuals or in isolation without reference to the report. Test scores are relative to age, gender, and educational history as available and appropriate.   Performance Validity        "A" Random Letter Test Raw  Descriptor      Errors 0 Within Expectation  The Dot Counting Test: 12 Within Expectation  NAB Effort Index 1 Within Expectation      Mental Status Screening     Total Score Descriptor  MoCA 25 Normal      Expected Functioning        Wide Range Achievement Test: Standard/Scaled Score Percentile      Word Reading 101 53      Reynolds Intellectual Screening Test Standard/T-score Percentile      Guess What 46 34      Odd Item Out 60 84  RIST Index 105 63      Attention/Processing Speed        Neuropsychological Assessment Battery (Attention Module, Form 1): T-score Percentile      Digits Forward 40 16      Digits Backwards 37 9      Repeatable Battery for the Assessment of Neuropsychological Status (Form A): Scaled Score Percentile      Coding 5 5      Language        Neuropsychological Assessment Battery (Language Module, Form 1): T-score Percentile      Naming   (30) 56 73      Verbal Fluency: T-score Percentile      Controlled Oral Word Association (F-A-S) 48 42      Semantic Fluency (Animals) 42 21      Memory:        Neuropsychological Assessment Battery (Memory Module, Form 1): T-score Percentile       List Learning           List A Immediate Recall   (5, 7, 7) 42 21         List B Immediate Recall   (2) 35 7         List A Short Delayed Recall   (7) 48 42         List A Long Delayed Recall   (11) 69 97         List A Percent Retention   (157 %) --- 98         List A Long Delayed Yes/No Recognition Hits   (11) --- 54         List A Long Delayed Yes/No Recognition False Alarms   (3) --- 54         List A Recognition Discriminability Index --- 58      Story Learning           Immediate Recall   (6, 22) 25 1         Delayed Recall   (17) 30 2  Percent Retention   (77 %) --- 27      Daily Living Memory            Immediate Recall   (17, 13) 32 4          Delayed Recall   (6, 6) 41 18          Percent Retention (92 %) --- 62          Recognition Hits    (7) --- 8      Repeatable Battery for the Assessment of Neuropsychological Status (Form A): Scaled Score Percentile         Figure Recall   (10) 8 25      Visuospatial/Constructional Functioning        Repeatable Battery for the Assessment of Neuropsychological Status (Form A): Standard/Scaled Score Percentile     Visuospatial/Constructional Index 92 30         Figure Copy   (19) 11 63         Judgment of Line Orientation   (14) --- 17-25      Executive Functioning        [employer] Test (MWCST): Standard/T-Score Percentile      Number of Categories Correct 32 4      Number of Perseverative Errors 37 9      Number of Total Errors 37 9      Percent Perseverative Errors 48 42  Executive Function Composite 74 4      Trail Making Test: T-Score Percentile      Part A 51 54      Part B 31 3      [place] Diagnostic Aphasia Exam: Raw Score Scaled Score      Complex Ideational Material 12 12      Clock Drawing Raw Score Descriptor      Command 8 Borderline Impairment      Rating Scales         Raw Score Descriptor  Patient Health Questionnaire - 9 9 Mild  GAD-7 14 Moderate      Clinical Dementia  Rating Raw Score Descriptor      Sum of Boxes 1.5 MCI      Global Score 0.5 MCI   Chaney Maclaren V. Nicole Kindred PsyD, [place] Clinical Neuropsychologist

## 2021-01-29 NOTE — Progress Notes (Signed)
[facility]  Patient Name: Robin Armstrong MRN: [id number] Date of Birth: 1955/10/05 Age: 65 y.o. Education: 14 years  Clinical Impressions  Robin Armstrong is a 65 y.o., right-hand dominant, married woman with a history of HLD, MDD, and memory and thinking problems that have been bothering her for more than 10 years. She also has a number of physical complaints including muscle spasm, subjective balance problems, and unsteadiness (but no objective problems on exam). She was evaluated previously by Dr. Sima Matas in 2012, who thought that she had cortically based issues and was concerned about a progressive condition (the basis for this concern is not clear because there is no compelling indication of decline in any area as per my review of the test data). She was seen by Dr. Si Raider in 2018, who felt that her test data were normal but did note relative weaknesses with memory encoding and processing speed. She returns for updated evaluation and feels that her symptoms are worse although most of the spontaneous complaints she had were the same as those discussed with Dr. Si Raider in 2018.   Neuropsychological test data shows marginally less than expected performance on measures of memory due to encoding issues, low scores on more challenging measures of processing speed, and scattered low scores on indicators of executive abilities. My sense is that diminished cognitive efficiency and/or executive control issues are the unifying deficit underlying all of these lfindings. Her scores are weaker than at her testing with Dr. Sima Matas, and while they are also marginally lower than at her evaluation in 2018 in some cases, most differences are likely within measurement error of her previous evaluation with Dr. Si Raider. She reported mild levels of depressive symptoms and moderate levels of anxiety symptoms.   Robin Armstrong is thus demonstrating neuropsychological test performance that  has not changed much over the past 4 years but is still less than expected for an individual of her ability level, warranting a diagnosis of mild cognitive impairment. While the possibility of an underlying condition cannot be entirely ruled out, I am more concerned about the cumulative effects of her affective issues. She also is on some potentially cognitively impairing medications. I would favor a non-Alzheimer's condition if there was an underlying cause for her cognitive difficulties, because her test data argue strongly against an impression of prodromal AD, but I think that any underlying condition is unlikely. She should nevertheless be monitored over time and I will reinforce the importance of ongoing involvement in her therapeutic activities.   Diagnostic Impressions: Mild cognitive impairment  Recommendations to be discussed with patient  Your performance and presentation on assessment today were consistent with a very mild level of cognitive difficulty. You had problems on measures involving encoding aspects of memory (getting things in), with processing speed, and on certain executive indicators. These areas of weakness are similar to your scores at the previous assessment, although there is a trend towards lower performance in several areas, and I think there is a sufficient number of low scores to say that this likely represents a decline for you even if it is very mild. Accordingly, the best diagnosis is mild cognitive impairment.  The major difference between mild cognitive impairment (MCI) and dementia is in severity and potential prognosis. Once someone reaches a level of severity adequate to be diagnosed with a dementia, there is usually progression over time, though this may be years. On the other hand, mild cognitive impairment, while a significant risk for dementia in  future, does not always progress to dementia, and in some instances stays the same or can even revert to normal. It is  important to realize that if MCI is due to underlying Alzheimer's disease, it will most likely progress to dementia eventually. The rate of conversion to Alzheimer's dementia from amnestic MCI is about 15% per year versus the general population risk of conversion of 2% per year.   In your case, I am not sure at all that your mild cognitive impairment is due to an underlying condition, and in fact I think it is more likely that it is due to other factors. Nonetheless, we cannot entirely rule out the possibility that there is an underlying cause for your cognitive difficulties, and therefore I will provide you with recommendations about preventative strategies that could potentially ameliorate any further decline if there was an underlying condition.  First, you have now had significant difficulties with anxiety/depression for nearly 40 years. Ongoing, persistent, major mental illness can certainly cause cognitive difficulties throughout the lifespan.  One way to conceptualize the impact of major mental illness on the brain is as accelerated brain aging. Oftentimes, individuals with mental health issues notice minor cognitive problems as they get older. This typically does not end up resulting in a severe problem, or dementia level of function, although it can be frustrating. I think this is probably a major factor in your case.   Second, you are on several potentially cognitively impairing medications. Lamotrigine, oxcarbazepine, and quetiapine all can negatively impact cognitive efficiency, thinking, and memory abilities. Obviously the symptoms that these medications are treating, such as sleeplessness and mood instability, can also cause cognitive problems so your providers may not be able to change them. You could discuss these medications with your prescribing providers if desired to see if any changes are possible. You should also know that it may not be possible to treat all your mental health and other  issues without some side effects. Do not alter the way that you take your medications without discussing them with your prescribing providers first.   Terms of minimizing risk of future decline in mild cognitive impairment, the best medicine that we have is in fact lifestyle changes. These generally involve changes to diet, changes in personal activities such as exercise, and changes in mental activities (including social activity).  There is now good quality evidence from at least one large scale study that a modified mediterranean diet may help slow cognitive decline. This is known as the "MIND" diet. The Mind diet is not so much a specific diet as it is a set of recommendations for things that you should and should not eat.   Foods that are ENCOURAGED on the MIND Diet:  Green, leafy vegetables: Aim for six or more servings per week. This includes kale, spinach, cooked greens and salads.  All other vegetables: Try to eat another vegetable in addition to the green leafy vegetables at least once a day. It is best to choose non-starchy vegetables because they have a lot of nutrients with a low number of calories.  Berries: Eat berries at least twice a week. There is a plethora of research on strawberries, and other berries such as blueberries, raspberries and blackberries have also been found to have antioxidant and brain health benefits.  Nuts: Try to get five servings of nuts or more each week. The creators of the MIND diet don't specify what kind of nuts to consume, but it is probably best to vary the  type of nuts you eat to obtain a variety of nutrients. Peanuts are a legume and do not fall into this category.  Olive oil: Use olive oil as your main cooking oil. There may be other heart-healthy alternatives such as algae oil, though there is not yet sufficient research upon which to base a formal recommendation.  Whole grains: Aim for at least three servings daily. Choose minimally processed grains  like oatmeal, quinoa, brown rice, whole-wheat pasta and 100% whole-wheat bread.  Fish: Eat fish at least once a week. It is best to choose fatty fish like salmon, sardines, trout, tuna and mackerel for their high amounts of omega-3 fatty acids.  Beans: Include beans in at least four meals every week. This includes all beans, lentils and soybeans.  Poultry: Try to eat chicken or [place] at least twice a week. Note that fried chicken is not encouraged on the MIND diet.  Wine: Aim for no more than one glass of alcohol daily. Both red and white wine may benefit the brain. However, much research has focused on the red wine compound resveratrol, which may help protect against Alzheimer's disease.  Foods that are DISCOURAGED on the MIND Diet: Butter and margarine: Try to eat less than 1 tablespoon (about 14 grams) daily. Instead, try using olive oil as your primary cooking fat, and dipping your bread in olive oil with herbs.  Cheese: The MIND diet recommends limiting your cheese consumption to less than once per week.  Red meat: Aim for no more than three servings each week. This includes all beef, pork, lamb and products made from these meats.  [place] food: The MIND diet highly discourages fried food, especially the kind from fast-food restaurants. Limit your consumption to less than once per week.  Pastries and sweets: This includes most of the processed junk food and desserts you can think of. Ice cream, cookies, brownies, snack cakes, donuts, candy and more. Try to limit these to no more than four times a week.  Staying active mentally is crucially important. This can include formal types of brain training, such as cognitive rehabilitation, sudoku, crossword puzzles and the like, although the best thing is to stay active and engaged in your life. This includes having meaningful hobbies and interests that you pursue and especially cultivating satisfying social relationships, which are a form of cognitive  stimulation and have been shown to be protective in and of themselves.    Exercise is one of the best medicines for promoting health and maintaining cognitive fitness at all stages in life. Exercise probably has the largest documented effect on brain health and performance of any lifestyle intervention. Studies have shown that even previously sedentary individuals who start exercising as late as age 49 show a significant survival benefit as compared to their non-exercising peers. In the [place], the current guidelines are for 30 minutes of moderate exercise per day, but increasing your activity level less than that may also be helpful. You do not have to get your 30 minutes of exercise in one shot and exercising for short periods of time spread throughout the day can be helpful. Go for several walks, learn to dance, or do something else you enjoy that gets your body moving. Of course, if you have an underlying medical condition or there is any question about whether it is safe for you to exercise, you should consult a medical treatment provider prior to beginning exercise.   It would be reasonable to repeat neuropsychological testing in the  future to keep an eye on your progress over time. I do not think you should come back any sooner than 18 to 24 months, unless otherwise clinically indicated.  Test Findings  Test scores are summarized in additional documentation associated with this encounter. Test scores are relative to age, gender, and educational history as available and appropriate. There were no concerns about performance validity as all findings fell within normal expectations.   General Intellectual Functioning/Achievement:  Performance on single word reading was average. Performance on the RIST index was toward the upper end of the average range, with an average score on the more verbally oriented test and high average score on the more visually oriented subtest. The RIST index was used as  a basis of comparison for the patient's cognitive test performance, in conjunction with her previous neuropsychological examination findings.  Attention and Processing Efficiency: Performance on indicators of attention involving digit repetition was weak, with low average scores for digit repetition forward and digit repetition backward. She had unusually low performance on timed number-symbol coding. Simple numeric sequencing was within the average range, although that is a very easy indicator. These findings are similar to if not a bit weaker than her previous performance with Dr. Si Raider.  Language: To confrontation naming was near errorless. Generation of words was average in response to the letters F-A-S and low average in response to the category prompt "animals". These findings are similar to if not just a bit weaker than her previous scores.  Visuospatial Function: Performance on visuospatial/constructional measures was average on the relevant RBANS index. Copy of a modestly complex figure was average. Judgment of angular line orientations was low average. Once again, these findings are similar to but just a bit weaker than her previous test scores, unclear if these differences exceed measurement error.  Learning and Memory: Performance on measures of learning and memory was less than expected for an individual of Robin Armstrong's measured ability. The profile shows difficulties primarily with encoding of information and very good retention of information across time, and is therefore not concerning for a memory storage problem. I wonder if attention and executive issues are attenuating her memory performance. Overall, there do not appear to be any very significant differences between her performances with Dr. Si Raider and within the present evaluation, although she did do better on some tests and worse on some others.  In the verbal realm, Robin Armstrong learned 5, 7, and 7 words of a 12 item word list  across 3 learning trials, which is low average. She retained 7 words at short delayed recall, which is average, but then had a hypernesia on long delayed recall, retrieving 11 words. This is truly outstanding and falls in the superior performance range. Yes/no recognition discriminability for words from the list versus false choices was average. Memory for short story was significantly weaker on immediate recall, with extremely low performance, but her retention of information fell at an average level and her performance was unusually low on delayed recall. Memory for brief, daily-living type information was unusually low on immediate recall although once again, her retention of information across time was average, and her delayed recall performance was low average.  Delayed recognition for this information was unusually low.  In the visual realm, delayed recall for modestly complex figure was average.  Executive Functions: Performance on executive indicators was somewhat below expectation, and may be just a bit weaker than at the previous evaluation. Her performance on the executive function composition of the  modified [place] card sorting test was unusually low. Unusually late low performance was also demonstrated on alternating sequencing of numbers and letters of the alphabet. By contrast, she did better when reasoning with verbal information on the complex ideational material, with an average score. Her clock drawing was also reasonable, with only minor errors in number placement and hand placement, consistent with "borderline impairment." Of words in response to the letters F-A-S was average.  Rating Scale(s): Performance on self rating inventories of symptoms associated with depression anxiety showed mild levels of depressive symptomatology and moderate levels of depressive symptoms.  I was able to rate a CDR for her, the sum of boxes score is 1.5, which is mild cognitive impairment at  most.  Viviano Simas. Nicole Kindred, PsyD, ABN Clinical Neuropsychologist  Coding and Compliance  Billing below reflects technician time, my direct face-to-face time with the patient, time spent in test administration, and time spent in professional activities including but not limited to: neuropsychological test interpretation, integration of neuropsychological test data with clinical history, report preparation, treatment planning, care coordination, and review of diagnostically pertinent medical history or studies.   Services associated with this encounter: Clinical Interview [phone]) plus 175 minutes (96132/96133; Neuropsychological Evaluation by Professional)  170 minutes (96136/96137; Test Administration by Professional)

## 2021-02-06 ENCOUNTER — Other Ambulatory Visit: Payer: Self-pay

## 2021-02-06 ENCOUNTER — Encounter: Payer: Self-pay | Admitting: Counselor

## 2021-02-06 ENCOUNTER — Ambulatory Visit (INDEPENDENT_AMBULATORY_CARE_PROVIDER_SITE_OTHER): Payer: BLUE CROSS/BLUE SHIELD | Admitting: Counselor

## 2021-02-06 DIAGNOSIS — G3184 Mild cognitive impairment, so stated: Secondary | ICD-10-CM | POA: Diagnosis not present

## 2021-02-06 DIAGNOSIS — F418 Other specified anxiety disorders: Secondary | ICD-10-CM

## 2021-02-06 NOTE — Progress Notes (Signed)
   [facility]  Feedback Note: I met with Robin Armstrong to review the findings resulting from her neuropsychological evaluation. Since the last appointment, she has been well. She presented with her husband, Robin Armstrong, who generally corroborated her impression of symptoms and the information she previously presented. Time was spent reviewing the impressions and recommendations that are detailed in the evaluation report. We discussed impression of mild cognitive impairment, as reflected in the patient instructions. I was candid with her that I cannot entirely rule out the possibility of a progressive condition but I do not think it likely. I also think there is fairly compelling evidence against an Alzheimer's type condition given preserved memory storage and other features. I took time to explain the findings and answer all the patient's questions. I encouraged Robin Armstrong to contact me should she have any further questions or if further follow up is desired.   Current Medications and Medical History   Current Outpatient Medications  Medication Sig Dispense Refill   aspirin 81 MG EC tablet Adult Low Dose Aspirin 81 mg tablet,delayed release  Take 1 tablet every day by oral route.     atorvastatin (LIPITOR) 20 MG tablet Take 20 mg by mouth daily.     busPIRone (BUSPAR) 7.5 MG tablet Take 7.5 mg by mouth 2 (two) times daily.     clonazePAM (KLONOPIN) 0.1 mg/mL SUSP Take by mouth. (Patient not taking: Reported on 11/06/2020)     Cyanocobalamin (B-12) 2500 MCG TABS Take by mouth.     doxepin (SINEQUAN) 10 MG capsule   0   FLUoxetine (PROZAC) 20 MG capsule Take 60 mg by mouth daily.      hydrocortisone cream 1 % Apply topically daily. 26 g 3   lamoTRIgine (LAMICTAL) 100 MG tablet Take 200 mg by mouth 2 (two) times daily.      OXcarbazepine (TRILEPTAL) 150 MG tablet Take 150 mg by mouth 2 (two) times daily.     QUEtiapine (SEROQUEL) 100 MG tablet Take 25 mg by mouth at  bedtime.      No current facility-administered medications for this visit.    There are no problems to display for this patient.   Mental Status and Behavioral Observations  Robin Armstrong presented on time to the present encounter and was alert and generally oriented. Speech was normal in rate, rhythm, volume, and prosody. Self-reported mood was "allright" and affect was blunted. Thought process was logical and goal oriented and thought content was appropriate to the topics discussed. There were no safety concerns identified at today's encounter, such as thoughts of harming self or others.   Plan  Feedback provided regarding the patient's neuropsychological evaluation. She has mild cognitive impairment, although I think it is most likely due to reversible causes and modifiable risk factors such as chronic depression/anxiety. Concern for an underlying condition is low although she can be followed over time to make sure nothing is missed. Robin Armstrong was encouraged to contact me if any questions arise or if further follow up is desired.   Viviano Simas Nicole Kindred, PsyD, ABN Clinical Neuropsychologist  Service(s) Provided at This Encounter: 42 minutes [phone]; Conjoint therapy with patient present)

## 2021-02-06 NOTE — Patient Instructions (Signed)
Your performance and presentation on assessment today were consistent with a very mild level of cognitive difficulty. You had problems on measures involving encoding aspects of memory (getting things in), with processing speed, and on certain executive indicators. These areas of weakness are similar to your scores at the previous assessment, although there is a trend towards lower performance in several areas, and I think there is a sufficient number of low scores to say that this likely represents a decline for you even if it is very mild. Accordingly, the best diagnosis is mild cognitive impairment.  The major difference between mild cognitive impairment (MCI) and dementia is in severity and potential prognosis. Once someone reaches a level of severity adequate to be diagnosed with a dementia, there is usually progression over time, though this may be years. On the other hand, mild cognitive impairment, while a significant risk for dementia in future, does not always progress to dementia, and in some instances stays the same or can even revert to normal. It is important to realize that if MCI is due to underlying Alzheimer's disease, it will most likely progress to dementia eventually. The rate of conversion to Alzheimer's dementia from amnestic MCI is about 15% per year versus the general population risk of conversion of 2% per year.    In your case, I am not sure at all that your mild cognitive impairment is due to an underlying condition, and in fact I think it is more likely that it is due to other factors. Nonetheless, we cannot entirely rule out the possibility that there is an underlying cause for your cognitive difficulties, and therefore I will provide you with recommendations about preventative strategies that could potentially ameliorate any further decline if there was an underlying condition.  First, you have now had significant difficulties with anxiety/depression for nearly 40 years. Ongoing,  persistent, major mental illness can certainly cause cognitive difficulties throughout the lifespan.  One way to conceptualize the impact of major mental illness on the brain is as accelerated brain aging. Oftentimes, individuals with mental health issues notice minor cognitive problems as they get older. This typically does not end up resulting in a severe problem, or dementia level of function, although it can be frustrating. I think this is probably a major factor in your case.   Second, you are on several potentially cognitively impairing medications. Lamotrigine, oxcarbazepine, and quetiapine all can negatively impact cognitive efficiency, thinking, and memory abilities. Obviously the symptoms that these medications are treating, such as sleeplessness and mood instability, can also cause cognitive problems so your providers may not be able to change them. You could discuss these medications with your prescribing providers if desired to see if any changes are possible. You should also know that it may not be possible to treat all your mental health and other issues without some side effects. Do not alter the way that you take your medications without discussing them with your prescribing providers first.   Terms of minimizing risk of future decline in mild cognitive impairment, the best medicine that we have is in fact lifestyle changes. These generally involve changes to diet, changes in personal activities such as exercise, and changes in mental activities (including social activity).  There is now good quality evidence from at least one large scale study that a modified mediterranean diet may help slow cognitive decline. This is known as the "MIND" diet. The Mind diet is not so much a specific diet as it is a set of  recommendations for things that you should and should not eat.   Foods that are ENCOURAGED on the MIND Diet:  Green, leafy vegetables: Aim for six or more servings per week. This includes  kale, spinach, cooked greens and salads.  All other vegetables: Try to eat another vegetable in addition to the green leafy vegetables at least once a day. It is best to choose non-starchy vegetables because they have a lot of nutrients with a low number of calories.  Berries: Eat berries at least twice a week. There is a plethora of research on strawberries, and other berries such as blueberries, raspberries and blackberries have also been found to have antioxidant and brain health benefits.  Nuts: Try to get five servings of nuts or more each week. The creators of the [place] don't specify what kind of nuts to consume, but it is probably best to vary the type of nuts you eat to obtain a variety of nutrients. Peanuts are a legume and do not fall into this category.  Olive oil: Use olive oil as your main cooking oil. There may be other heart-healthy alternatives such as algae oil, though there is not yet sufficient research upon which to base a formal recommendation.  Whole grains: Aim for at least three servings daily. Choose minimally processed grains like oatmeal, quinoa, brown rice, whole-wheat pasta and 100% whole-wheat bread.  Fish: Eat fish at least once a week. It is best to choose fatty fish like salmon, sardines, trout, tuna and mackerel for their high amounts of omega-3 fatty acids.  Beans: Include beans in at least four meals every week. This includes all beans, lentils and soybeans.  Poultry: Try to eat chicken or [place] at least twice a week. Note that fried chicken is not encouraged on the MIND diet.  Wine: Aim for no more than one glass of alcohol daily. Both red and white wine may benefit the brain. However, much research has focused on the red wine compound resveratrol, which may help protect against Alzheimer's disease.  Foods that are DISCOURAGED on the MIND Diet: Butter and margarine: Try to eat less than 1 tablespoon (about 14 grams) daily. Instead, try using olive oil as your  primary cooking fat, and dipping your bread in olive oil with herbs.  Cheese: The MIND diet recommends limiting your cheese consumption to less than once per week.  Red meat: Aim for no more than three servings each week. This includes all beef, pork, lamb and products made from these meats.  [place] food: The MIND diet highly discourages fried food, especially the kind from fast-food restaurants. Limit your consumption to less than once per week.  Pastries and sweets: This includes most of the processed junk food and desserts you can think of. Ice cream, cookies, brownies, snack cakes, donuts, candy and more. Try to limit these to no more than four times a week.   Staying active mentally is crucially important. This can include formal types of brain training, such as cognitive rehabilitation, sudoku, crossword puzzles and the like, although the best thing is to stay active and engaged in your life. This includes having meaningful hobbies and interests that you pursue and especially cultivating satisfying social relationships, which are a form of cognitive stimulation and have been shown to be protective in and of themselves.     Exercise is one of the best medicines for promoting health and maintaining cognitive fitness at all stages in life. Exercise probably has the largest documented effect on  brain health and performance of any lifestyle intervention. Studies have shown that even previously sedentary individuals who start exercising as late as age 15 show a significant survival benefit as compared to their non-exercising peers. In the [place], the current guidelines are for 30 minutes of moderate exercise per day, but increasing your activity level less than that may also be helpful. You do not have to get your 30 minutes of exercise in one shot and exercising for short periods of time spread throughout the day can be helpful. Go for several walks, learn to dance, or do something else you enjoy that  gets your body moving. Of course, if you have an underlying medical condition or there is any question about whether it is safe for you to exercise, you should consult a medical treatment provider prior to beginning exercise.    It would be reasonable to repeat neuropsychological testing in the future to keep an eye on your progress over time. I do not think you should come back any sooner than 18 to 24 months, unless otherwise clinically indicated.

## 2022-04-08 ENCOUNTER — Ambulatory Visit: Payer: Medicare Other | Admitting: Physician Assistant
